# Patient Record
Sex: Male | Born: 1945 | Race: White | Hispanic: No | Marital: Married
Health system: Midwestern US, Academic
[De-identification: ages and names within clinical notes are randomized; demographics above are authoritative.]

---

## 2016-12-12 ENCOUNTER — Encounter: Admit: 2016-12-12 | Discharge: 2016-12-15 | Payer: MEDICARE

## 2016-12-12 DIAGNOSIS — M6289 Other specified disorders of muscle: Principal | ICD-10-CM

## 2016-12-15 DIAGNOSIS — N5231 Erectile dysfunction following radical prostatectomy: ICD-10-CM

## 2016-12-15 DIAGNOSIS — N393 Stress incontinence (female) (male): ICD-10-CM

## 2017-01-08 ENCOUNTER — Encounter: Admit: 2017-01-08 | Discharge: 2017-01-15 | Payer: MEDICARE

## 2017-01-15 DIAGNOSIS — M6289 Other specified disorders of muscle: Principal | ICD-10-CM

## 2017-01-15 DIAGNOSIS — N5231 Erectile dysfunction following radical prostatectomy: ICD-10-CM

## 2017-02-19 ENCOUNTER — Encounter: Admit: 2017-02-19 | Discharge: 2017-02-19 | Payer: MEDICARE

## 2017-02-19 DIAGNOSIS — C61 Malignant neoplasm of prostate: Principal | ICD-10-CM

## 2017-02-19 LAB — PROSTATIC SPECIFIC ANTIGEN-PSA: Lab: 0 ng/mL — ABNORMAL HIGH (ref <6.01)

## 2017-02-20 ENCOUNTER — Encounter: Admit: 2017-02-20 | Discharge: 2017-02-20 | Payer: MEDICARE

## 2017-02-20 DIAGNOSIS — N529 Male erectile dysfunction, unspecified: ICD-10-CM

## 2017-02-20 DIAGNOSIS — C61 Malignant neoplasm of prostate: Secondary | ICD-10-CM

## 2017-02-20 DIAGNOSIS — K219 Gastro-esophageal reflux disease without esophagitis: ICD-10-CM

## 2017-02-20 DIAGNOSIS — E785 Hyperlipidemia, unspecified: ICD-10-CM

## 2017-02-20 DIAGNOSIS — G4733 Obstructive sleep apnea (adult) (pediatric): ICD-10-CM

## 2017-02-20 DIAGNOSIS — I10 Essential (primary) hypertension: ICD-10-CM

## 2017-02-20 DIAGNOSIS — R972 Elevated prostate specific antigen [PSA]: ICD-10-CM

## 2017-02-20 DIAGNOSIS — C4491 Basal cell carcinoma of skin, unspecified: ICD-10-CM

## 2017-02-20 DIAGNOSIS — I719 Aortic aneurysm of unspecified site, without rupture: ICD-10-CM

## 2017-02-20 DIAGNOSIS — N400 Enlarged prostate without lower urinary tract symptoms: Principal | ICD-10-CM

## 2017-02-20 NOTE — Progress Notes
Name: Kenneth Macdonald          MRN: [id number]      DOB: [birth year]      AGE: 71 y.o.   DATE OF SERVICE: 02/20/2017    Subjective:             Reason for Visit:  Prostate Cancer      Kenneth Macdonald is a 71 y.o. male.     Cancer Staging  Prostate cancer [facility])  Staging form: Prostate, AJCC 8th Edition  - Pathologic stage from 07/31/2016: Stage IIIB (pT3b, pN0, cM0, PSA: 4.5, Grade Group: 3) - Signed by Maryagnes Amos, PA-C on 02/19/2017      History of Present Illness  Kenneth Macdonald is a very pleasant 71 year old man who is 6 months from his robotic prostatectomy complicated by an incarcerated umbilical hernia.  He has been doing well since his last visit with 1-3 pad per day incontinence.  He is awakening at night multiple times; but he notes that he drinks a lot in the evening as well.  Otherwise no complaints.         Review of Systems   Constitutional: Positive for activity change, appetite change and diaphoresis. Negative for chills, fatigue, fever and unexpected weight change.   HENT: Positive for hearing loss and tinnitus. Negative for congestion, dental problem, drooling, ear discharge, ear pain, facial swelling, mouth sores, nosebleeds, postnasal drip, rhinorrhea, sinus pressure, sneezing, sore throat, trouble swallowing and voice change.    Eyes: Negative for photophobia, pain, discharge, redness, itching and visual disturbance.   Respiratory: Positive for apnea. Negative for cough, choking, chest tightness, shortness of breath, wheezing and stridor.    Cardiovascular: Negative for chest pain, palpitations and leg swelling.   Gastrointestinal: Negative for abdominal distention, abdominal pain, anal bleeding, blood in stool, constipation, diarrhea, nausea, rectal pain and vomiting.   Endocrine: Negative for cold intolerance, heat intolerance, polydipsia, polyphagia and polyuria.   Genitourinary: Positive for enuresis. Negative for decreased urine volume, difficulty urinating, discharge, dysuria, flank pain, frequency, genital sores, hematuria, penile pain, penile swelling, scrotal swelling, testicular pain and urgency.   Musculoskeletal: Negative for arthralgias, back pain, gait problem, joint swelling, myalgias, neck pain and neck stiffness.   Skin: Negative for color change, pallor, rash and wound.   Neurological: Negative for dizziness, tremors, seizures, syncope, facial asymmetry, speech difficulty, weakness, light-headedness, numbness and headaches.   Hematological: Negative for adenopathy. Does not bruise/bleed easily.   Psychiatric/Behavioral: Negative for agitation, behavioral problems, confusion, decreased concentration, dysphoric mood, hallucinations, self-injury, sleep disturbance and suicidal ideas. The patient is not nervous/anxious and is not hyperactive.          Objective:         ??? ACETAMINOPHEN (TYLENOL PO) Take 1,000 mg by mouth daily as needed.   ??? aspirin EC 81 mg tablet Take 81 mg by mouth daily. Take with food.   ??? cholecalciferol (VITAMIN D-3) 1,000 units tablet Take 5,000 Units by mouth daily.   ??? FOLIC ACID/MULTIVIT-MIN/LUTEIN (CENTRUM SILVER PO) Take 1 tablet by mouth daily.   ??? hyoscyamine sulfate (LEVSIN/SL) 0.125 mg sublingual tablet Place 1 tablet under tongue every 4 hours as needed for Cramps. Bladder spasms   ??? lisinopril-hydrochlorothiazide (PRINZIDE, ZESTORETIC) 20-12.5 mg tablet Take 1 tablet by mouth every morning.   ??? loratadine (CLARITIN) 10 mg tablet Take 10 mg by mouth every morning.   ??? naproxen (NAPROSYN) 500 mg tablet Take 500 mg by mouth daily. Take with food.   ???  nitrofurantoin monohyd/m-cryst (MACROBID) 100 mg capsule Take 1 capsule by mouth every 12 hours. Take with food. Begin taking the day prior to catheter removal.   ??? omega 3-dha-epa-fish oil (FISH OIL) 100-160-1,000 mg cap Take 2 capsules by mouth daily.   ??? omeprazole DR(+) (PRILOSEC) 20 mg capsule Take 20 mg by mouth daily before breakfast. ??? oxybutynin chloride (DITROPAN) 5 mg tablet Take 1 tablet by mouth three times daily as needed. Bladder spasms   ??? oxyCODONE/acetaminophen (PERCOCET; ENDOCET; ROXICET) 5/325 mg tablet Take 1 tablet by mouth every 4 hours as needed   ??? polyethylene glycol 3350 (GLYCOLAX; MIRALAX) 17 gram/dose powder Take 17 g by mouth daily.   ??? polymyxin/bacitracin/neo/HC (CORTISPORIN) 1 % topical ointment Small amount to head of penis/catheter 3 times daily to reduce irritation.   ??? senna/docusate (SENOKOT-S) 8.6/50 mg tablet Take 1 tablet by mouth twice daily.   ??? simvastatin (ZOCOR) 40 mg tablet Take 20 mg by mouth at bedtime daily.   ??? vitamin E 400 unit capsule Take 400 Units by mouth daily.     Vitals:    02/20/17 0757   BP: 144/78   Pulse: 78   Resp: 18   Temp: 36.8 ???C (98.2 ???F)   TempSrc: Oral   SpO2: 97%   Weight: 134.2 kg (295 lb 13.7 oz)   Height: 185.4 cm (72.99)     Body mass index is 39.04 kg/m???.     Pain Score: Zero         Pain Addressed:  N/A    Patient Evaluated for a Clinical Trial: Patient not eligible for a treatment trial (including not needing treatment, needs palliative care, in remission). and No treatment clinical trial available for this patient.     [place] Cooperative Oncology Group performance status is 0, Fully active, able to carry on all pre-disease performance without restriction.Marland Kitchen     Physical Exam   Constitutional: He is oriented to person, place, and time. He appears well-developed and well-nourished. No distress.   HENT:   Head: Normocephalic and atraumatic.   Eyes: EOM are normal. No scleral icterus.   Pulmonary/Chest: Effort normal. No respiratory distress.   Abdominal: Soft. He exhibits no distension.   Well healed incisions.   Musculoskeletal: Normal range of motion. He exhibits no edema.   Neurological: He is alert and oriented to person, place, and time.   Skin: Skin is warm and dry. No rash noted.   Psychiatric: He has a normal mood and affect. His behavior is normal. Judgment and thought content normal.   Vitals reviewed.       Lab Results   Component Value Date    PSA 0.08 02/19/2017    PSA 0.06 11/13/2016          Assessment and Plan:    Problem   Prostate Cancer (Hcc)    Fusion biopsy 06/10/16  Gleason 3+3=6 right prostate 1/6  Gleason 3+4=7 left prostate involving  4/6 cores    07/31/2016: RALP, BPLND, BWD Gleason 4+3=7 with tertiary 5, pT3aN0 (0/29)MxR1 (2mm pattern 4 and 5 at margin).  Lab Results   Component Value Date    PSA 0.08 02/19/2017    PSA 0.06 11/13/2016              Prostate cancer (HCC)  I had the pleasure of visiting with Kenneth Macdonald in clinic today.  He is recovering well with 1-3 pad per day incontinence.  I reassured him this should continue to improve.  Unfortunately his  PSA returned at 0.08.  At this time, based on his pathology, I recommended returning in 3 months with a PSA and radiation oncology consultation.    Plan:  1.  RTC in 3 months with a PSA  2.  Radiation oncology consult    Myna Hidalgo, MD  Urologic Oncology  Department of Urology

## 2017-02-20 NOTE — Assessment & Plan Note
I had the pleasure of visiting with Mr. Kenneth Macdonald in clinic today.  He is recovering well with 1-3 pad per day incontinence.  I reassured him this should continue to improve.  Unfortunately his PSA returned at 0.08.  At this time, based on his pathology, I recommended returning in 3 months with a PSA and radiation oncology consultation.    Plan:  1.  RTC in 3 months with a PSA  2.  Radiation oncology consult

## 2017-03-21 ENCOUNTER — Encounter: Admit: 2017-03-21 | Discharge: 2017-03-21 | Payer: MEDICARE

## 2017-03-21 DIAGNOSIS — C61 Malignant neoplasm of prostate: Principal | ICD-10-CM

## 2017-03-21 NOTE — Telephone Encounter
Patients daughter called in wanting a referral placed to have her dad do genetic testing. States that during the last appt Dr Jerline Pain thought that it would be a good idea that the children be tested. She states that she has developed a lump on her breast and was told by her Dr that her dad needs to have genetic testing done also.  Can we place a order that have this done.  Please call patients daughter for further questions.

## 2017-03-21 NOTE — Progress Notes
Received message from patient's daughter.  I called patient and explained that his daughter reached out to [place] requesting that he undergo genetic testing.  He was unsure if he wanted to have genetic testing; explained role of genetic counselor.  Patient agreeable to see genetic counselor.    Then returned call to daughter, Earnest Bailey.  She is wanting father to have genetic testing within the next week as she just found new breast mass.  Ordered entered for Dietitian.  Will reach out to staff to coordinate this as soon as possible.

## 2017-03-25 ENCOUNTER — Encounter: Admit: 2017-03-25 | Discharge: 2017-03-25 | Payer: MEDICARE

## 2017-03-25 DIAGNOSIS — C61 Malignant neoplasm of prostate: Principal | ICD-10-CM

## 2017-03-26 LAB — MISC REFERENCE TEST

## 2017-03-26 NOTE — Progress Notes
Hereditary Cancer Clinic   Genetic Counseling  Date: 03/26/2017    Kenneth Macdonald  [place]# [id number]  DOB July 24, 1945    Referral / Diagnosis: Prostate Cancer  Family history ovarian cancer, breast cancer, prostate cancer, colon cancer, uterus cancer    Ref: Self / [place], MS, [facility] (seeing his daughter in clinic)  [place] Provider(s):  Ross Marcus, MD    Kenneth Macdonald, 71 y.o., was seen in clinic today with his wife and daughter because of his the diagnosis of prostate cancer (dx 52), and extensive family history of cancer including two sisters and his mother with ovarian cancer, two sisters and a niece with breast cancer, and a sister with colon cancer. He also has a niece with uterine cancer.  Clinical hx provided by patient/family: Prostate cancer dx 18 (Gleason 7)    Family History provided (see 3-generation pedigree):  - Maternal:  Mother d. 31 ovarian cancer dx 68.  Aunts / Uncles / Cousins: Aunt d. 34s?, with son d. 67s car accident, and a daughter now in her 4s.  Grandparents: Both grandparents died in adulthood, no ca  [place]: [place] / no known Ashkenazi Jewish ancestry  - Paternal:  Father d. 25 lung ca dx 95  Aunts / Uncles / Cousins: Uncle d. 44 no ca (he had son d. 34s ?prostate ca ?lung ca, and another son d. Lung ca)  Grandparents: Grandparents both died in their 71s, no known ca  Ancestry: [place] / no known Ashkenazi Jewish ancestry  - Siblings (11):   Sister Kenneth Macdonald d. 17 ovarian ca dx 63s (TAH) - she had a daughter now age 53 with breast cancer dx before age 25   Sister Kenneth Macdonald d. 78 colon cancer dx 77   Sister Kenneth Macdonald d. 73 ovarian cancer dx 74s   Sister Kenneth Macdonald 45, breast cancer dx 65   Brother Kenneth Macdonald d. 81 ?prostate cancer dx 90   Brother Kenneth Macdonald d. 66 lung ca dx 82s   Sister Kenneth Macdonald now age 34, breast cancer dx 67 (ER+) - she has a daughter age 2 with uterine cancer dx 37   Sister Kenneth Macdonald deceased, BSO 12s   Sister Kenneth Macdonald d. 30s brain hemorrhage Sister Kenneth Macdonald deceased, TAH/BSO 63s   Brother Kenneth Macdonald d. 33  - Children (2): Daughter 49 with TAH (bleeding), and 67 (here today)    Of note: his wife's mother had peritoneal cancer, she plans to return with him to clinic for his follow-up and have genetic testing at that time.    Discussion:    - We discussed hereditary cancer syndromes involving prostate, ovarian cancer, breast cancer, prostate cancer, colon cancer, uterus cancer and other cancers.  Red flags for hereditary syndromes include bilateral / multifocal disease, young age of onset (often <50 yrs), and 2-3 relatives with the same, or related conditions.  - Testing for multiple genes for cancer susceptibility was recommended (a genetic panel).  IF a gene from the panel is found, this would affect medical management including surveillance for early detection and treatment decisions.  - We reviewed the possible outcomes of testing (e.g., positive, negative or variant of uncertain/unknown significance)  - We discussed the various syndromes and diseases associated with the genes on the panel.  - NCCN guidelines discussed for Hereditary Breast / Ovarian Cancer (HBOC), Lynch Syndrome and other conditions associated with genes on the panel.     Our discussion also included the patient's increased risk status, how genes affect cancer susceptibility, potential benefits,  risk, and limitations of testing, as well as methods for early detection and prevention.     Plan:??? Blood was drawn today for the [facility] CustomNext Cancer Panel (38 genes) including:  - CancerNext Panel 34 genes: APC, ATM, BARD1, BMPR1A, BRCA1, BRCA2, BRIP1, CDH1, CDK4, CDKN2A, CHEK2, DICER1, HOXB13, MLH1, MRE11A, MSH2 (includes  MSH2 inversion analysis), MSH6, MUTYH, NBN, NF1, PALB2, PMS2, POLD1, POLE, PTEN, RAD50, RAD51C, RAD51D, SMAD4, SMARCA4, STK11 and TP53 (sequencing and deletion/duplication); EPCAM and GREM1 (deletion/duplication only)??? including genes for susceptibility to breast cancer, ovarian cancer, colon cancer, pancreas cancer and other genes associated with colon polyps and other hereditary cancer.??? Sequencing / deletion / duplication unless otherwise noted.???  - plus AXIN2, MSH3, NTHL1, FH    Information regarding insurance was discussed: The laboratory will preauthorize with insurance and provide notification if the cost is not covered, or if there will be any out-of-pocket responsibilities.    Time:  60 minutes.  Obtained and reviewed family history.  Discussed hereditary cancer syndromes, autosomal dominant inheritance, presymptomatic surveillance, management and treatment.  Testing outcomes, including possibility of uncertain variants, strategies for testing other family members if a gene alteration is found.    Completed paperwork including consent for genetic testing.    Kenneth Mainland, MS, [facility]  Certified Genetic Counselor  [facility]  [address] [place], [place]  [address]    Email: [email or web address]  Telephone [phone]  Fax:  [phone]  Appointments [phone]    Pending:  Follow-up in Clinic for DNA test results / interpretation.    Appointment in 2-4 weeks  to discuss genetic test results and additional testing on family members as appropriate.    Note: His daughter would benefit from this information, and results were requested ASAP    Note: IF the genetic testing results are positive, genetic testing for relatives will be at no charge within 90 days of the test report. This information was provided

## 2017-04-12 ENCOUNTER — Encounter: Admit: 2017-04-12 | Discharge: 2017-04-12 | Payer: MEDICARE

## 2017-04-12 NOTE — Telephone Encounter
Message received from patients wife stating "His results for genetic testing were supposed to be posted before yesterday and they and the weren't.  I wondered if anybody could help [place] with that?"    Routing to nurse Cloyde Reams J. for follow up.

## 2017-05-14 ENCOUNTER — Encounter: Admit: 2017-05-14 | Discharge: 2017-05-14 | Payer: MEDICARE

## 2017-05-14 DIAGNOSIS — Z803 Family history of malignant neoplasm of breast: ICD-10-CM

## 2017-05-14 DIAGNOSIS — C61 Malignant neoplasm of prostate: Principal | ICD-10-CM

## 2017-05-14 DIAGNOSIS — Z8042 Family history of malignant neoplasm of prostate: ICD-10-CM

## 2017-05-14 DIAGNOSIS — Z8041 Family history of malignant neoplasm of ovary: ICD-10-CM

## 2017-05-15 NOTE — Progress Notes
Hereditary Cancer Clinic   Genetic Counseling  Follow-up for Test Results  Date: 05/14/2017    Kenneth Macdonald  [place]# [id number]  DOB [birth year]    Referral / Diagnosis: Prostate Cancer  Family history ovarian cancer, breast cancer, prostate cancer, colon cancer, uterus cancer  ???  Ref: Self / French Ana, MS, [facility] (seeing his daughter in clinic)  [place] Provider(s):  Ross Marcus, MD    Kenneth Macdonald, 71 y.o., was seen today (with his wife) to review his genetic test results for the following   Genetic Panel (see [facility] lab report):    [facility] CustomNext Cancer Panel (38 genes) including:  - CancerNext Panel 34 genes: APC, ATM, BARD1, BMPR1A, BRCA1, BRCA2, BRIP1, CDH1, CDK4, CDKN2A, CHEK2, DICER1, HOXB13, MLH1, MRE11A, MSH2 (includes??? MSH2 inversion analysis), MSH6, MUTYH, NBN, NF1, PALB2, PMS2, POLD1, POLE, PTEN, RAD50, RAD51C, RAD51D, SMAD4, SMARCA4, STK11 and TP53 (sequencing and deletion/duplication); EPCAM and GREM1 (deletion/duplication only)??? including genes for susceptibility to breast cancer, ovarian cancer, colon cancer, pancreas cancer and other genes associated with colon polyps and other hereditary cancer.??? Sequencing / deletion / duplication unless otherwise noted.???  - plus AXIN2, MSH3, NTHL1, FH    RESULTS: (see Genetics Lab report for more details)     1) NEGATIVE for most of the genes on the nextgeneration cancer panel:    - no deleterious genetic mutations were found in the BRCA1 gene or the BRCA2 gene  (genes associated with Hereditary Breast Ovarian Cancer - HBOC)  - no deleterious genetic mutations were found in genes associated with Lynch syndrome (MLH1, MSH2, MSH6, PMS2, EPCAM)  - no deleterious mutations found in genes associate with polyposis (APC, MUTYH, GREM1, POLD1, POLE)  - no deleterious genetic mutation were found in other genes on the panel associated with prostate, breast, ovary, uterus, colon, pancreas or other cancers Results interpretation: NEGATIVE for genes requiring clinical action    2) 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   A VUS is a change, or variant, in a gene that has never been seen before or because of conflicting or incomplete information in the medical literature, its association with cancer risk is unknown. In other words, it cannot be determined yet whether this genetic variant is associated with an increased risk of cancer or is a harmless, normal genetic variant.    Discussion:   This essentially negative result does not exclude a genetic basis for the reported personal and/or family history of cancer. It is possible that there is a pathogenic mutation that is not detectable by this analysis or in a gene that is not included on the panel.     Recommendations: No clinical action is indicated at this time, based on this genetic variant. In the absence of a definitive mutation, this the risk for future cancers and medical management recommendations should be based on personal and family history of cancer.    For individuals  diagnosed with cancer  - Continue medical care as recommended. It is possible that this genetic variation is the reason for developed cancer; however, it is also possible that this variant is a normal genetic variant and is not the cause for cancer.   - A VUS may be reclassified. If this is the  case, a new report will be re-issued, which could provide valuable information to doctors and extended family members who may wish to pursue predictive genetic testing.  - Risk for future cancers and medical management recommendations should be based on personal and family history of cancer - following appropriate guideline for screening including mammograms, colonoscopies, and other evaluations.    - This negative result does not exclude a genetic basis for the reported personal and/or family history of cancer in this patient. It is possible that this patient has a pathogenic mutation that is not detectable by this analysis or in a gene that is not included on the panel.     Family Members Without Cancer  - It is not recommended that family members without associated cancers undergo predictive testing for variants of unknown significance since this would not provide additional information about whether family members are at increased risk to develop cancer.  - Individuals cannot pass genes (mutated genes) to their children which they do not have.  Children therefore do not need to be tested, based on these test results (unless there are other risk factors from the other side of the family)    -  VUS testing is not offered for offspring because of the lack of clinical guidelines for any test results.    Consider checking back periodically to see if newer tests are available in the future with more genes which may be tested.    Regarding the VUS (Variant of Uncertain / Unknown Significance) suggestions:  - consider entering data into the PROMPT registry - an online registry for patients who have had genetic panel testing and are found to have mutations or variants of uncertain significance (VUS). This registry is a joint effort between laboratories and academic medical centers to learn more about the genes studied on these panels and hopefully, over time, develop a better understanding of the cancer risks associated with these genes and the best way to take care of patients with these mutations. While all of the genes on these panels have been tied to an increased risk of cancer, the understanding of the risks associated with some of the genes are understood more than others. Participation involves completing online surveys and patients can enroll directly through the provided website if interested www.promptstudy.org.   - consider periodic review of ClinVar or other variant classification database (see instructions provided)  - consider participating in online research, GenomeConnect (genomeconnect.org) to advance knowledge of genetic disorders by sharing de-identified genetic and health information    Truett Mainland, MS, [facility]  Certified Genetic Counselor  [place] [facility]  [address] [place], [place]  [address]    Email: [email or web address]  Telephone [phone]  Fax:  [phone]  Appointments [phone]    Copy of laboratory test results, clinical notes, pedigree, lab test information provided in clinic.   Copy of test results and pedigree to electronic medical record.   He has requested that his records be sent to French Ana, MS, [facility] genetic counselor for his daughter      See previous clinic notes:  --------------------------------------------------------------------------------------------------------------------------------------------------------------------------------------------------------------------------------------------------------  Hereditary Cancer Clinic   Genetic Counseling  Date: 03/26/2017  ???  Bianca Vester  [place]# [id number]  DOB 05-05-1946  ???  Referral / Diagnosis: Prostate Cancer  Family history ovarian cancer, breast cancer, prostate cancer, colon cancer, uterus cancer  ???  Ref: Self / [place], MS, [facility] (seeing his daughter in clinic)  [place] Provider(s):  Ross Marcus, MD  ???  Kenneth Macdonald, 71 y.o., was seen in clinic today with his wife and daughter because of his the diagnosis of prostate cancer (dx 58), and extensive family history of cancer including two sisters and his mother with ovarian cancer, two sisters and a niece with breast cancer, and a sister with colon cancer. He also has a niece with uterine cancer.  Clinical hx provided by patient/family: Prostate cancer dx 71 (Gleason 7)  ??? Family History provided (see 3-generation pedigree):  - Maternal:  Mother d. 100 ovarian cancer dx 69.  Aunts / Uncles / Cousins: Aunt d. 76s?, with son d. 4s car accident, and a daughter now in her 75s.  Grandparents: Both grandparents died in adulthood, no ca  [place]: [place] / no known Ashkenazi Jewish ancestry  - Paternal:  Father d. 92 lung ca dx 46  Aunts / Uncles / Cousins: Uncle d. 30 no ca (he had son d. 72s ?prostate ca ?lung ca, and another son d. Lung ca)  Grandparents: Grandparents both died in their 77s, no known ca  Ancestry: [place] / no known Ashkenazi Jewish ancestry  - Siblings (11):               Sister Genevieve d. 58 ovarian ca dx 5s (TAH) - she had a daughter now age 99 with breast cancer dx before age 54               Sister Mary d. 33 colon cancer dx 83               Sister Windell Moulding d. 38 ovarian cancer dx 71s               Sister Okey Regal 38, breast cancer dx 30               Brother Delbert d. 25 ?prostate cancer dx 22               Brother Phil d. 30 lung ca dx 75s               Sister Britta Mccreedy now age 65, breast cancer dx 66 (ER+) - she has a daughter age 16 with uterine cancer dx 44               Sister [place] deceased, BSO 27s               Sister Environmental health practitioner d. 30s brain hemorrhage               Sister Nelda deceased, TAH/BSO 37s               Brother Charles d. 42  - Children (2): Daughter 90 with TAH (bleeding), and 61 (here today)    Of note: his wife's mother had peritoneal cancer, she plans to return with him to clinic for his follow-up and have genetic testing at that time.  ???  Discussion:    - We discussed hereditary cancer syndromes involving prostate, ovarian cancer, breast cancer, prostate cancer, colon cancer, uterus cancer and other cancers.  Red flags for hereditary syndromes include bilateral / multifocal disease, young age of onset (often <50 yrs), and 2-3 relatives with the same, or related conditions. - Testing for multiple genes for cancer susceptibility was recommended (a genetic panel).  IF a gene from the panel is found, this would affect medical management including surveillance for early detection and treatment decisions.  - We reviewed the possible outcomes of  testing (e.g., positive, negative or variant of uncertain/unknown significance)  - We discussed the various syndromes and diseases associated with the genes on the panel.  - NCCN guidelines discussed for Hereditary Breast / Ovarian Cancer (HBOC), Lynch Syndrome and other conditions associated with genes on the panel.   ???  Our discussion also included the patient's increased risk status, how genes affect cancer susceptibility, potential benefits, risk, and limitations of testing, as well as methods for early detection and prevention.   ???  Plan:??? Blood was drawn today for the [facility] CustomNext Cancer Panel (38 genes) including:  - CancerNext Panel 34 genes: APC, ATM, BARD1, BMPR1A, BRCA1, BRCA2, BRIP1, CDH1, CDK4, CDKN2A, CHEK2, DICER1, HOXB13, MLH1, MRE11A, MSH2 (includes??? MSH2 inversion analysis), MSH6, MUTYH, NBN, NF1, PALB2, PMS2, POLD1, POLE, PTEN, RAD50, RAD51C, RAD51D, SMAD4, SMARCA4, STK11 and TP53 (sequencing and deletion/duplication); EPCAM and GREM1 (deletion/duplication only)??? including genes for susceptibility to breast cancer, ovarian cancer, colon cancer, pancreas cancer and other genes associated with colon polyps and other hereditary cancer.??? Sequencing / deletion / duplication unless otherwise noted.???  - plus AXIN2, MSH3, NTHL1, FH  ???  Information regarding insurance was discussed: The laboratory will preauthorize with insurance and provide notification if the cost is not covered, or if there will be any out-of-pocket responsibilities.  ???  Time:  60 minutes.  Obtained and reviewed family history.  Discussed hereditary cancer syndromes, autosomal dominant inheritance, presymptomatic surveillance, management and treatment.  Testing outcomes, including possibility of uncertain variants, strategies for testing other family members if a gene alteration is found.    Completed paperwork including consent for genetic testing.  ???  Truett Mainland, MS, [facility]  Certified Genetic Counselor  [place] [facility]  [address] [place], [place]  [address]  ???  Email: [email or web address]  Telephone [phone]  Fax:  [phone]  Appointments [phone]  ???  Pending:  Follow-up in Clinic for DNA test results / interpretation.    Appointment in 2-4 weeks  to discuss genetic test results and additional testing on family members as appropriate.  ???  Note: His daughter would benefit from this information, and results were requested ASAP  ???  Note: IF the genetic testing results are positive, genetic testing for relatives will be at no charge within 90 days of the test report. This information was provided

## 2017-06-02 ENCOUNTER — Ambulatory Visit: Admit: 2017-06-02 | Discharge: 2017-06-17 | Payer: MEDICARE

## 2017-06-04 ENCOUNTER — Encounter: Admit: 2017-06-04 | Discharge: 2017-06-04 | Payer: MEDICARE

## 2017-06-04 DIAGNOSIS — C61 Malignant neoplasm of prostate: Principal | ICD-10-CM

## 2017-06-04 LAB — PROSTATIC SPECIFIC ANTIGEN-PSA: Lab: 0 ng/mL (ref <6.01)

## 2017-06-04 NOTE — Progress Notes
Treatment Summary for Prostate cancer [facility])     San Jetty, APRN-NP  06/04/2017  7:48 PM      Cancer Treatment Summary  Provided by San Jetty, APRN-NP on 06/04/2017       General Information   Patient Name Kenneth Macdonald   Patient ID [id number]   Phone [phone] (home)    Date of birth [birth year]   Age 71 y.o.   Support Contact Extended Emergency Contact Information  Primary Emergency Contact: Galan,Delores   [place]  Home Phone: [phone]  Relation: Spouse         Care Team   Patient Care Team:  Early, Otis Dials, MD as PCP - General (Orthopedic Surgery)      Cancer Diagnosis Information   Symptoms/Signs Elevated PSA   Diagnosis Prostate cancer [facility])   Diagnosis Date 07/11/2015 (biopsy)   Staging Information Cancer Staging    Prostate cancer [facility])  Staging form: Prostate, AJCC 8th Edition    - Pathologic stage from 07/31/2016: Stage IIIB (pT3b, pN0, cM0, PSA: 4.5, Grade Group: 3) - Signed by Maryagnes Amos, PA-C on 02/19/2017       Tumor & Prognostic Markers PSA 4.5  No results found for: YQ657, CA2729, HER2NEU   Genomic Testing NA   Surgical Procedure: Location/Findings Past Surgical History:   Procedure Laterality Date   ??? KNEE ARTHROSCOPY Right ~2010   ??? SKIN CANCER EXCISION  ~1990    BCC Lip        Tumor Type/Histology/Grade Gleason 4+3=7         Background Information   Family History/predisposing conditions Family History   Problem Relation Age of Onset   ??? Cancer Mother    ??? Cancer Father    ??? Cancer Sister    ??? Diabetes Sister    ??? Cancer Brother    ??? Cancer-Prostate Brother    ??? Diabetes Brother         Warden/ranger NA   Social History Social History     Tobacco Use   ??? Smoking status: Former Smoker     Packs/day: 1.00     Years: 20.00     Pack years: 20.00     Types: Cigarettes     Last attempt to quit: 07/19/2001     Years since quitting: 15.8   ??? Smokeless tobacco: Never Used   Substance Use Topics   ??? Alcohol use: No   ??? Drug use: No                       [No treatment plan] Pre-Treatment Post-Treatment   Height  Ht Readings from Last 1 Encounters:   02/20/17 185.4 cm (72.99)        Weight  Wt Readings from Last 1 Encounters:   02/20/17 134.2 kg (295 lb 13.7 oz)        BSA  Estimated body surface area is 2.63 meters squared as calculated from the following:    Height as of 02/20/17: 185.4 cm (72.99).    Weight as of 02/20/17: 134.2 kg (295 lb 13.7 oz).      Lifetime Dosage   Lifetime Dose Tracking:   No doses have been documented on this patient for the following tracked chemicals: mitomycin, epirubicin, doxorubicin, idarubicin, bleomycin, daunorubicin, mitoxantrone, vincristine, doxorubicin HCl pegylated liposomal, daunorubicin citrate liposomal              Follow-Up & Survivorship Care   Future Appointments   Date Time  Provider Department Center   06/05/2017 10:00 AM Ross Marcus, MD [facility] [place] Ex[facility]   06/05/2017 11:00 AM Harriette Bouillon, MD [facility]     Cancer surveillance:     1. Treatment team may include:  a. Urologic oncologist  b. Radiation oncologist  c. Medical oncologist  2. Follow-up interval:  Your treatment team will continue to monitor you in case the prostate cancer returns.  Follow-up testing will include period PSA measurements and evaluations for side effects from treatment:  a. Year 1: Every 3 months  b. Year 2: Every 6 months  c. Year 3-5: Yearly  d. After year 5: PSA should be checked every year with any physcian  3. Labs:  You will have a PSA lab check with your follow-up visits.  a. PSA should be low after treatment.  After surgery, unless your physician develops a different plan of care, a PSA of <0.2 is considered acceptable.  Anything over 0.2 is concerning for recurrence and should be evaluated by your surgeon.     Side effect surveillance:     Common side effects:  some long-term side effects may happen after surgery.  The two most common issues are erectile dysfunction and incontinence. - Patients who have had their prostate removed may experience incontinence (leakage).  Please make sure you continue doing the Kegel (pelvic floor) exercises.  The incontinence should improve over time but if this persists or is bothersome, please discuss with your care team as there are other options available for treatment of urinary incontinence after surgery.  - Erectile dysfunction.  Many factors can affect your erectile function after surgery and it make take several years for this to improve.  There are various options available for the treatment of erectile dysfunction, including oral, intraurethral and injection therapies; a vacuum device; and a penile prosthesis.  Please let us know if you experience worsening erections and wish to try medication or other interventions.     Hormonal Therapy     Hormonal therapy is commonly used for patients with prostate cancer.  Common forms of hormonal therapy are daily pills (such as biclutamide) or injections (such as Lupron or Degarelix).  These treatments are designed to eliminate the male hormones (androgens) from the body.  Not having androgens in the body starves the prostate cancer of its most important growth factor and helps to slow recurrence.     Frequent side-effects of hormonal therapy:  1. Fatigue  2. Decreased concentration or mood changes  3. Loss of libido (sex drive)  4. Slower metabolism  5. Loss of muscle mass/weight gain  6. Bone loss  7. Muscle or joint aches     Additional health considerations during hormonal therapy  1. You should have regular follow-ups with your primary care physician so that they can follow your blood glucose levels as well as your cholesterol levels.  2. Bone health: you should be taking additional calcium and vitamin D.    a. For men age 47-70: 1000 mg calcium and 400 ??? 1000 IU of vitamin D  b. For men age over 15: 1200 mg calcium and 800 ??? 1000 IU of vitamin D c. If you are on long-term hormonal therapy, your doctor may order a DEXA (bone density) scan to see if you need additional medications to help strengthen the bones to help prevent fracture.  3. Diet/Exercise:  It is very important to eat healthy foods and stay active with aerobic exercise during hormonal therapy to prevent some  of the above side effects from hormonal therapy.          Health Maintenance:      For patients with prostate cancer, patients are actually more likely to die from heart disease or other causes than from prostate cancer.  It is very important to maintain good overall health.  This includes:     1. Regular check-up with your primary care physician   2. Smoking.  If you are a smoker, we strongly encourage you to quit smoking.  If you need help to stop smoking, please let us know.  3. Eat a heart healthy diet  a. Avoid excess alcohol or sugary beverages  b. Avoid processed foods   c. [facility] offers free nutritional counseling for patients with cancer.  If you would like to meet with a registered dietician to discuss your diet, please let us know so that we can coordinate this.  4. Stay fit with daily exercise  5. Cancer screening.   a. Screening colonoscopies  b. If you have a strong history of smoking, please ask about lung cancer screening with low-dose chest CT.  6. Kegel exercises   a. Daily Kegel exercises???especially crucial in patients after prostatectomy.  b. While you???re urinating, try to stop the flow of urine. Start and stop it as often as you can.   c. Contract as if you were stopping your urine stream, but do it when you???re not urinating  d. Tighten your rectum as if trying not to pass gas. Contract your anus, but don???t move your buttocks.   e. Do your Kegels as often as you can. The more you do them, the faster you???ll feel the results.   f. Pick an activity you do often as a reminder to do your Kegel exercises. For instance, do your Kegels every time you sit down. Frequently Asked Questions:     1. How will I know if the cancer has come back?     The PSA (prostate specific antigen) blood test is the most sensitive test for detecting prostate cancer recurrence.  The PSA will almost always rise first before cancer is detected in any other way (such as physical exam or scans).  A rising PSA does not always mean that the prostate cancer has recurred.  Please consult your physician if you have questions about your PSA level.     2. Will there be any scans?      Generally, scans are not necessary after a prostatectomy.        3. Do I have any restrictions on my activities?     Not at all!  Actually, patients are encouraged to be as active as they can.   Increased physical activity is associated with better overall feeling and health.     4. Will alternative medicines/herbal supplements help my cancer?     There is no proven evidence that any supplement or herbal medication prevents cancer recurrence or reduces side effects.  It should be noted that herbal preparations are not monitored for purity by the FDA.  It is also important to realize that supplements may interact with other medications and treatments so they should be used cautiously.     5. I have a family history of prostate cancer.  What does this mean for my children?     Most prostate cancers are not inherited.  However, if you have a strong family history of prostate cancer or other cancers, it is worthwhile to consider genetic  counseling as other relatives may be at higher risk than the general population for certain cancers.  There are some tests to look for inherited genetic syndromes that may increase the risk of cancer.  Additionally, you should encourage your male children to undergo PSA testing in their 47s with anything greater than 1.5 warranting an evaluation.  Please ask [place] for more information.     Urology Main Phone Number: [phone] I will give a copy to the patient at his appointment tomorrow.

## 2017-06-05 ENCOUNTER — Encounter: Admit: 2017-06-05 | Discharge: 2017-06-05 | Payer: MEDICARE

## 2017-06-05 ENCOUNTER — Ambulatory Visit: Admit: 2017-06-05 | Discharge: 2017-06-05 | Payer: MEDICARE

## 2017-06-05 DIAGNOSIS — E785 Hyperlipidemia, unspecified: ICD-10-CM

## 2017-06-05 DIAGNOSIS — G4733 Obstructive sleep apnea (adult) (pediatric): ICD-10-CM

## 2017-06-05 DIAGNOSIS — C61 Malignant neoplasm of prostate: Principal | ICD-10-CM

## 2017-06-05 DIAGNOSIS — R972 Elevated prostate specific antigen [PSA]: ICD-10-CM

## 2017-06-05 DIAGNOSIS — K219 Gastro-esophageal reflux disease without esophagitis: ICD-10-CM

## 2017-06-05 DIAGNOSIS — C4491 Basal cell carcinoma of skin, unspecified: ICD-10-CM

## 2017-06-05 DIAGNOSIS — N529 Male erectile dysfunction, unspecified: ICD-10-CM

## 2017-06-05 DIAGNOSIS — I719 Aortic aneurysm of unspecified site, without rupture: ICD-10-CM

## 2017-06-05 DIAGNOSIS — I10 Essential (primary) hypertension: ICD-10-CM

## 2017-06-05 DIAGNOSIS — N400 Enlarged prostate without lower urinary tract symptoms: Principal | ICD-10-CM

## 2017-06-05 NOTE — Progress Notes
Had been getting psa checked at [place]. Felt nodule in past. Had biopsies in the past that were negative.  Lives in [place]  Had biopsy done at [facility] after MRI showed lesion.    Changes pad 6 times a day. Occasionally has leakage without any stress.  No longer improving.     Otherwise healthy.   Family history: Brother died from prostate cancer at 6. Dad had lung cancer. Mother had ovarian. 8/12 siblings had cancer. 2 sisters and a niece with breast cancer. 2 sisters with ovarian.    Had colonoscopy 3 months ago. 3 small polyps removed.     Has had genetic counseling. Brca1/2 negative. No other genetic mutatoins.

## 2017-06-05 NOTE — Progress Notes
Name: Kenneth Macdonald          MRN: [id number]      DOB: 03/19/1946      AGE: 71 y.o.   DATE OF SERVICE: 06/05/2017    Subjective:             Reason for Visit:  Heme/Onc Care      Kenneth Macdonald is a 71 y.o. male.     Cancer Staging  Prostate cancer [facility])  Staging form: Prostate, AJCC 8th Edition  - Pathologic stage from 07/31/2016: Stage IIIB (pT3b, pN0, cM0, PSA: 4.5, Grade Group: 3) - Signed by Maryagnes Amos, PA-C on 02/19/2017      History of Present Illness  Kenneth Macdonald is a very pleasant 71 year old man who has a history of prostate cancer treated with radical prostatectomy on 07/31/2016.  His pathology was notable for pT3bN0MxR1 4+3=7 with tertiary 5.  He has had persistent incontinence with 6-7 pad per day incontinence which he feels is related to his activity level.  He has not had any functional erections to date.         Review of Systems   Constitutional: Negative for activity change, appetite change, chills, diaphoresis, fatigue, fever and unexpected weight change.   HENT: Negative for congestion, dental problem, drooling, ear discharge, ear pain, facial swelling, hearing loss, mouth sores, nosebleeds, postnasal drip, rhinorrhea, sinus pressure, sneezing, sore throat, tinnitus, trouble swallowing and voice change.    Eyes: Negative for photophobia, pain, discharge, redness, itching and visual disturbance.   Respiratory: Negative for apnea, cough, choking, chest tightness, shortness of breath, wheezing and stridor.    Cardiovascular: Negative for chest pain, palpitations and leg swelling.   Gastrointestinal: Negative for abdominal distention, abdominal pain, anal bleeding, blood in stool, constipation, diarrhea, nausea, rectal pain and vomiting.   Endocrine: Negative for cold intolerance, heat intolerance, polydipsia, polyphagia and polyuria.   Genitourinary: Positive for enuresis. Negative for decreased urine volume, difficulty urinating, discharge, dysuria, flank pain, frequency, genital sores, hematuria, penile pain, penile swelling, scrotal swelling, testicular pain and urgency.   Musculoskeletal: Negative for arthralgias, back pain, gait problem, joint swelling, myalgias, neck pain and neck stiffness.   Skin: Negative for color change, pallor, rash and wound.   Neurological: Negative for dizziness, tremors, seizures, syncope, facial asymmetry, speech difficulty, weakness, light-headedness, numbness and headaches.   Hematological: Negative for adenopathy. Does not bruise/bleed easily.   Psychiatric/Behavioral: Negative for agitation, behavioral problems, confusion, decreased concentration, dysphoric mood, hallucinations, self-injury, sleep disturbance and suicidal ideas. The patient is not nervous/anxious and is not hyperactive.          Objective:         ??? ACETAMINOPHEN (TYLENOL PO) Take 1,000 mg by mouth daily as needed.   ??? aspirin EC 81 mg tablet Take 81 mg by mouth daily. Take with food.   ??? cholecalciferol (VITAMIN D-3) 1,000 units tablet Take 5,000 Units by mouth daily.   ??? FOLIC ACID/MULTIVIT-MIN/LUTEIN (CENTRUM SILVER PO) Take 1 tablet by mouth daily.   ??? hyoscyamine sulfate (LEVSIN/SL) 0.125 mg sublingual tablet Place 1 tablet under tongue every 4 hours as needed for Cramps. Bladder spasms   ??? lisinopril-hydrochlorothiazide (PRINZIDE, ZESTORETIC) 20-12.5 mg tablet Take 1 tablet by mouth every morning.   ??? loratadine (CLARITIN) 10 mg tablet Take 10 mg by mouth every morning.   ??? naproxen (NAPROSYN) 500 mg tablet Take 500 mg by mouth daily. Take with food.   ??? nitrofurantoin monohyd/m-cryst (MACROBID) 100 mg capsule Take  1 capsule by mouth every 12 hours. Take with food. Begin taking the day prior to catheter removal.   ??? omega 3-dha-epa-fish oil (FISH OIL) 100-160-1,000 mg cap Take 2 capsules by mouth daily.   ??? omeprazole DR(+) (PRILOSEC) 20 mg capsule Take 20 mg by mouth daily before breakfast.   ??? oxybutynin chloride (DITROPAN) 5 mg tablet Take 1 tablet by mouth three times daily as needed. Bladder spasms   ??? oxyCODONE/acetaminophen (PERCOCET; ENDOCET; ROXICET) 5/325 mg tablet Take 1 tablet by mouth every 4 hours as needed   ??? polyethylene glycol 3350 (GLYCOLAX; MIRALAX) 17 gram/dose powder Take 17 g by mouth daily.   ??? polymyxin/bacitracin/neo/HC (CORTISPORIN) 1 % topical ointment Small amount to head of penis/catheter 3 times daily to reduce irritation.   ??? senna/docusate (SENOKOT-S) 8.6/50 mg tablet Take 1 tablet by mouth twice daily.   ??? simvastatin (ZOCOR) 40 mg tablet Take 20 mg by mouth at bedtime daily.   ??? vitamin E 400 unit capsule Take 400 Units by mouth daily.     Vitals:    06/05/17 1014   BP: 142/78   Pulse: 84   Resp: 20   Temp: 36.7 ???C (98 ???F)   TempSrc: Oral   SpO2: 96%   Weight: (!) 137.8 kg (303 lb 12.8 oz)   Height: 185.4 cm (72.99)     Body mass index is 40.09 kg/m???.     Pain Score: Zero         Pain Addressed:  N/A    Patient Evaluated for a Clinical Trial: Patient not eligible for a treatment trial (including not needing treatment, needs palliative care, in remission). and No treatment clinical trial available for this patient.     [place] Cooperative Oncology Group performance status is 0, Fully active, able to carry on all pre-disease performance without restriction.Marland Kitchen     Physical Exam   Constitutional: He is oriented to person, place, and time. He appears well-developed and well-nourished. No distress.   HENT:   Head: Normocephalic and atraumatic.   Eyes: EOM are normal. No scleral icterus.   Neck: Normal range of motion.   Pulmonary/Chest: Effort normal. No respiratory distress.   Abdominal: Soft. Bowel sounds are normal. He exhibits no distension. There is no tenderness.   Well-healed incision sites.  No palpable hernia.   Musculoskeletal: Normal range of motion. He exhibits no edema.   Lymphadenopathy:     He has no cervical adenopathy.   Neurological: He is alert and oriented to person, place, and time.   Skin: Skin is warm and dry. Psychiatric: He has a normal mood and affect. His behavior is normal. Judgment and thought content normal.   Vitals reviewed.            Assessment and Plan:    Problem   Prostate Cancer (Hcc)    Fusion biopsy 06/10/16  Gleason 3+3=6 right prostate 1/6  Gleason 3+4=7 left prostate involving  4/6 cores    07/31/2016: RALP, BPLND, BWD Gleason 4+3=7 with tertiary 5, pT3aN0 (0/29)MxR1 (2mm pattern 4 and 5 at margin).  Lab Results   Component Value Date    PSA 0.09 06/04/2017    PSA 0.08 02/19/2017    PSA 0.06 11/13/2016              Prostate cancer [facility])  Mr. Skowron PSA is detectable but slowly rising.  He does have some incontinence, which is around 6-7 pads per day.  I discussed his need to increase his activity,  perform Kegel exercises, and lose weight as an initial treatment for his incontinence.  I encouraged him to visit with his PCP for his multiple other concerns.  Otherwise, I recommended continuing to monitor the PSA until the PSA reaches 0.2 as the definition of recurrence.      Plan:  1.  PSA in 3 months  2.  Kegel exercises and exercise / diet changes  3.  RTC in 3 months      Myna Hidalgo, MD  Urologic Oncology  Department of Urology

## 2017-06-05 NOTE — Assessment & Plan Note
Kenneth Macdonald PSA is detectable but slowly rising.  He does have some incontinence, which is around 6-7 pads per day.  I discussed his need to increase his activity, perform Kegel exercises, and lose weight as an initial treatment for his incontinence.  I encouraged him to visit with his PCP for his multiple other concerns.  Otherwise, I recommended continuing to monitor the PSA until the PSA reaches 0.2 as the definition of recurrence.      Plan:  1.  PSA in 3 months  2.  Kegel exercises and exercise / diet changes  3.  RTC in 3 months

## 2017-06-18 NOTE — Progress Notes
Radiation Oncology Consultation    Date:  06/18/2017    Kenneth Macdonald is a 72 y.o. male.     Requesting Provider: Ross Marcus, MD     The encounter diagnosis was Prostate cancer [facility]).  Staging: Cancer Staging  Prostate cancer [facility])  Staging form: Prostate, AJCC 8th Edition  - Pathologic stage from 07/31/2016: Stage IIIB (pT3b, pN0, cM0, PSA: 4.5, Grade Group: 3) - Signed by Maryagnes Amos, PA-C on 02/19/2017      History of Present Illness:   Kenneth Macdonald is a 72 y.o. male with prostate cancer who presents today for discussion of treatment options.  Patient has had a history of rising PSA from about 0.9 in 2009, up to 4.5 on 05/07/2016.  Of note, he had been on Proscar for some years for urinary symptoms.  He had had 2 prior negative biopsies.  On 06/10/2016, he underwent MRI which demonstrated a lesion in the left posterior lateral aspect of the gland.  He underwent fusion biopsy which demonstrated Gleason 3+4 cancer in 4 6 cores, and Gleason 3+3 = 6 in 1 of 6 cores.  The directed biopsy of the suspicious lesion was negative.  He elected to pursue radical prostatectomy, which was performed on 07/31/2016.  Pathology revealed a Gleason 4+3 cancer with tertiary 5 pattern.  Margin was positive at the left posterior lateral margin.  There was seminal vesicle involvement.  There was also extensive extracapsular extension on the left.  All nodes were negative.  He has somewhat difficult postoperative course, with an umbilical hernia and incarceration postoperatively which required emergent reduction and repair.  Postoperative PSA at 3 months was detectable at 0.06.  This has since increased to 0.09 on 06/04/2017.    He is now recovering well.  His urination recovery is stable.  He does use about 6 pads per day, but he is particularly conscientious about being dry.  He does have a family history of some cancers, and has had genetic evaluation.  He does not carry the BRCA gene mutation. He is referred to me for discussion of postoperative radiation.         Past Medical History:  Patient Active Problem List    Diagnosis Date Noted   ??? Urinary incontinence, male, stress 08/15/2016   ??? Prostate cancer (HCC) 07/19/2016     Past Medical History:   Diagnosis Date   ??? Aortic aneurysm (HCC) ~2013    Per patient very small   ??? BCC (basal cell carcinoma of skin) ~1990    Lip   ??? Dyslipidemia    ??? Elevated PSA    ??? Enlarged prostate    ??? Erectile dysfunction    ??? GERD (gastroesophageal reflux disease)    ??? Hypertension    ??? OSA on CPAP      Past Surgical History:   Procedure Laterality Date   ??? KNEE ARTHROSCOPY Right ~2010   ??? SKIN CANCER EXCISION  ~1990    BCC Lip         Prior Radiation History:   None    Medications  ??? ACETAMINOPHEN (TYLENOL PO) Take 1,000 mg by mouth daily as needed.   ??? aspirin EC 81 mg tablet Take 81 mg by mouth daily. Take with food.   ??? cholecalciferol (VITAMIN D-3) 1,000 units tablet Take 5,000 Units by mouth daily.   ??? FOLIC ACID/MULTIVIT-MIN/LUTEIN (CENTRUM SILVER PO) Take 1 tablet by mouth daily.   ??? hyoscyamine sulfate (LEVSIN/SL) 0.125 mg sublingual tablet Place  1 tablet under tongue every 4 hours as needed for Cramps. Bladder spasms   ??? lisinopril-hydrochlorothiazide (PRINZIDE, ZESTORETIC) 20-12.5 mg tablet Take 1 tablet by mouth every morning.   ??? loratadine (CLARITIN) 10 mg tablet Take 10 mg by mouth every morning.   ??? naproxen (NAPROSYN) 500 mg tablet Take 500 mg by mouth daily. Take with food.   ??? nitrofurantoin monohyd/m-cryst (MACROBID) 100 mg capsule Take 1 capsule by mouth every 12 hours. Take with food. Begin taking the day prior to catheter removal.   ??? omega 3-dha-epa-fish oil (FISH OIL) 100-160-1,000 mg cap Take 2 capsules by mouth daily.   ??? omeprazole DR(+) (PRILOSEC) 20 mg capsule Take 20 mg by mouth daily before breakfast.   ??? oxybutynin chloride (DITROPAN) 5 mg tablet Take 1 tablet by mouth three times daily as needed. Bladder spasms ??? oxyCODONE/acetaminophen (PERCOCET; ENDOCET; ROXICET) 5/325 mg tablet Take 1 tablet by mouth every 4 hours as needed   ??? polyethylene glycol 3350 (GLYCOLAX; MIRALAX) 17 gram/dose powder Take 17 g by mouth daily.   ??? polymyxin/bacitracin/neo/HC (CORTISPORIN) 1 % topical ointment Small amount to head of penis/catheter 3 times daily to reduce irritation.   ??? senna/docusate (SENOKOT-S) 8.6/50 mg tablet Take 1 tablet by mouth twice daily.   ??? simvastatin (ZOCOR) 40 mg tablet Take 20 mg by mouth at bedtime daily.   ??? vitamin E 400 unit capsule Take 400 Units by mouth daily.       Allergies:   Allergies   Allergen Reactions   ??? Ciprofloxacin HIVES and ITCHING   ??? Levaquin [Levofloxacin] RASH and ITCHING       Social History:  Social History     Socioeconomic History   ??? Marital status: Married     Spouse name: Not on file   ??? Number of children: Not on file   ??? Years of education: Not on file   ??? Highest education level: Not on file   Social Needs   ??? Financial resource strain: Not on file   ??? Food insecurity - worry: Not on file   ??? Food insecurity - inability: Not on file   ??? Transportation needs - medical: Not on file   ??? Transportation needs - non-medical: Not on file   Occupational History   ??? Not on file   Tobacco Use   ??? Smoking status: Former Smoker     Packs/day: 1.00     Years: 20.00     Pack years: 20.00     Types: Cigarettes     Last attempt to quit: 07/19/2001     Years since quitting: 15.9   ??? Smokeless tobacco: Never Used   Substance and Sexual Activity   ??? Alcohol use: No   ??? Drug use: No   ??? Sexual activity: Not on file   Other Topics Concern   ??? Not on file   Social History Narrative   ??? Not on file        Family History:  Family History   Problem Relation Age of Onset   ??? Cancer Mother    ??? Cancer Father    ??? Cancer Sister    ??? Diabetes Sister    ??? Cancer Brother    ??? Cancer-Prostate Brother    ??? Diabetes Brother        Review of Systems Patient Evaluated for a Clinical Trial: No treatment clinical trial available for this patient.    KARNOFSKY PERFORMANCE SCORE:  90% Able to carry on  normal activity; minor signs of disease   Physical Exam     Vitals:    06/05/17 1141   BP: 122/56   Pulse: 79   Temp: 36.7 ???C (98 ???F)   SpO2: 97%     GEN: NAD, AOx3  HEENT: PERRL, EMOi  CV: Normal S1, S2, no murmurs  Pulm: Lungs clear bilaterally  Abd: Soft, non-tender, non-distended  Ext: No clubbing, cyanosis, edema  MSK: No spine tenderness    LABORATORY:     Comprehensive Metabolic Profile    Lab Results   Component Value Date/Time    NA 137 08/03/2016 11:58 PM    K 3.7 08/03/2016 11:58 PM    CL 103 08/03/2016 11:58 PM    CO2 25 08/03/2016 11:58 PM    GAP 9 08/03/2016 11:58 PM    BUN 27 (H) 08/03/2016 11:58 PM    CR 1.02 08/03/2016 11:58 PM    GLU 157 (H) 08/03/2016 11:58 PM    Lab Results   Component Value Date/Time    CA 8.7 08/03/2016 11:58 PM    ALBUMIN 4.3 08/03/2016 03:55 PM    TOTPROT 7.8 08/03/2016 03:55 PM    ALKPHOS 43 08/03/2016 03:55 PM    AST 81 (H) 08/03/2016 03:55 PM    ALT 71 (H) 08/03/2016 03:55 PM    TOTBILI 0.9 08/03/2016 03:55 PM    GFR >60 08/03/2016 11:58 PM    GFRAA >60 08/03/2016 11:58 PM        CBC w diff    Lab Results   Component Value Date/Time    WBC 10.5 08/03/2016 11:58 PM    RBC 4.15 (L) 08/03/2016 11:58 PM    HGB 12.4 (L) 08/03/2016 11:58 PM    HCT 38.6 (L) 08/03/2016 11:58 PM    MCV 92.9 08/03/2016 11:58 PM    MCH 29.9 08/03/2016 11:58 PM    MCHC 32.2 08/03/2016 11:58 PM    RDW 14.0 08/03/2016 11:58 PM    PLTCT 216 08/03/2016 11:58 PM    MPV 7.5 08/03/2016 11:58 PM    Lab Results   Component Value Date/Time    NEUT 86 (H) 08/03/2016 11:58 PM    ANC 9.10 (H) 08/03/2016 11:58 PM    LYMA 9 (L) 08/03/2016 11:58 PM    ALC 0.90 (L) 08/03/2016 11:58 PM    MONA 5 08/03/2016 11:58 PM    AMC 0.50 08/03/2016 11:58 PM    EOSA 0 08/03/2016 11:58 PM    AEC 0.00 08/03/2016 11:58 PM    BASA 0 08/03/2016 11:58 PM    ABC 0.00 08/03/2016 11:58 PM IMAGING: N/A    PATHOLOGY: As above.  Pathology report in [facility] EMR.         ASSESSMENT: 72 y.o. male with pT3bN0M0 GS 7 (4+3 tertiary 5) PSA 4.5 (on finasteride), group 3B prostate cancer.  I discussed the natural history of recurrent prostate cancer as well as management options including observation, ADT, and salvage radiation +/- ADT.  He does not yet meet the AUA guideline criteria for biochemical recurrence of 0.2, though the persistent PSA and rising PSA after surgery is suggestive of a residual cancer.    Multiple institutional retrospective studies have demonstrated improved survival with salvage radiation therapy in patients with biochemical recurrence of prostate cancer after radical prostatectomy.  Typically, earlier initiation of salvage radiation is associated with improved biochemical control.  In the most recent update of the multi-institutional salvage radiation series, treatment at a PSA level of less than 0.1  was associated with improved biochemical control compared to greater than 0.1.      This patient has relatively good health, and I would recommend salvage radiation therapy.  Kenneth Macdonald wanted to hold off on any treatment at the current time.    If his PSA level reaches 0.1 at the next check, he would be eligible for the [facility] GU 006 clinical trial.    Kenneth Macdonald will follow up with Dr. Jimmey Ralph in 4 months.  I will reevaluate him at that time as well.  Anticipate that the PSA will be increased, and I plan to offer him salvage radiation therapy and participation in clinical trial.    Harriette Bouillon, MD  Attending Physician  Department of Radiation Oncology, [facility]    ATTESTATION    I personally performed the key portions of the E/M visit, discussed case with resident and concur with resident documentation of history, physical exam, assessment, and treatment plan unless otherwise noted.    Staff name:  Harriette Bouillon, MD Date:  06/18/2017 CC: A copy of this note has been sent to the referring provider, Dr. Ross Marcus, MD.

## 2017-07-16 ENCOUNTER — Encounter: Admit: 2017-07-16 | Discharge: 2017-07-16 | Payer: MEDICARE

## 2017-07-30 ENCOUNTER — Encounter: Admit: 2017-07-30 | Discharge: 2017-07-30 | Payer: MEDICARE

## 2017-07-30 DIAGNOSIS — Z8042 Family history of malignant neoplasm of prostate: ICD-10-CM

## 2017-07-30 DIAGNOSIS — C61 Malignant neoplasm of prostate: Principal | ICD-10-CM

## 2017-07-30 DIAGNOSIS — Z8041 Family history of malignant neoplasm of ovary: ICD-10-CM

## 2017-07-30 DIAGNOSIS — Z803 Family history of malignant neoplasm of breast: ICD-10-CM

## 2017-09-17 ENCOUNTER — Encounter: Admit: 2017-09-17 | Discharge: 2017-09-17 | Payer: MEDICARE

## 2017-09-17 DIAGNOSIS — C61 Malignant neoplasm of prostate: Principal | ICD-10-CM

## 2017-09-17 LAB — PROSTATIC SPECIFIC ANTIGEN-PSA: Lab: 0.1 ng/mL (ref <6.01)

## 2017-09-18 ENCOUNTER — Encounter: Admit: 2017-09-18 | Discharge: 2017-09-18 | Payer: MEDICARE

## 2017-09-18 ENCOUNTER — Ambulatory Visit: Admit: 2017-09-18 | Discharge: 2017-09-18 | Payer: MEDICARE

## 2017-09-18 DIAGNOSIS — C61 Malignant neoplasm of prostate: Principal | ICD-10-CM

## 2017-09-18 DIAGNOSIS — I719 Aortic aneurysm of unspecified site, without rupture: ICD-10-CM

## 2017-09-18 DIAGNOSIS — E785 Hyperlipidemia, unspecified: ICD-10-CM

## 2017-09-18 DIAGNOSIS — I10 Essential (primary) hypertension: ICD-10-CM

## 2017-09-18 DIAGNOSIS — G4733 Obstructive sleep apnea (adult) (pediatric): ICD-10-CM

## 2017-09-18 DIAGNOSIS — N400 Enlarged prostate without lower urinary tract symptoms: Principal | ICD-10-CM

## 2017-09-18 DIAGNOSIS — R972 Elevated prostate specific antigen [PSA]: ICD-10-CM

## 2017-09-18 DIAGNOSIS — C4491 Basal cell carcinoma of skin, unspecified: ICD-10-CM

## 2017-09-18 DIAGNOSIS — K219 Gastro-esophageal reflux disease without esophagitis: ICD-10-CM

## 2017-09-18 DIAGNOSIS — N529 Male erectile dysfunction, unspecified: ICD-10-CM

## 2017-09-19 ENCOUNTER — Encounter: Admit: 2017-09-19 | Discharge: 2017-09-19 | Payer: MEDICARE

## 2017-09-19 DIAGNOSIS — G4733 Obstructive sleep apnea (adult) (pediatric): ICD-10-CM

## 2017-09-19 DIAGNOSIS — N529 Male erectile dysfunction, unspecified: ICD-10-CM

## 2017-09-19 DIAGNOSIS — E785 Hyperlipidemia, unspecified: ICD-10-CM

## 2017-09-19 DIAGNOSIS — I10 Essential (primary) hypertension: ICD-10-CM

## 2017-09-19 DIAGNOSIS — C4491 Basal cell carcinoma of skin, unspecified: ICD-10-CM

## 2017-09-19 DIAGNOSIS — R972 Elevated prostate specific antigen [PSA]: ICD-10-CM

## 2017-09-19 DIAGNOSIS — K219 Gastro-esophageal reflux disease without esophagitis: ICD-10-CM

## 2017-09-19 DIAGNOSIS — N400 Enlarged prostate without lower urinary tract symptoms: Principal | ICD-10-CM

## 2017-09-19 DIAGNOSIS — I719 Aortic aneurysm of unspecified site, without rupture: ICD-10-CM

## 2017-09-30 ENCOUNTER — Ambulatory Visit: Admit: 2017-09-16 | Discharge: 2017-09-30 | Payer: MEDICARE

## 2017-09-30 DIAGNOSIS — C61 Malignant neoplasm of prostate: Principal | ICD-10-CM

## 2017-10-01 ENCOUNTER — Ambulatory Visit: Admit: 2017-10-01 | Discharge: 2017-10-15 | Payer: MEDICARE

## 2017-10-03 ENCOUNTER — Ambulatory Visit: Admit: 2017-10-03 | Discharge: 2017-10-04 | Payer: MEDICARE

## 2017-10-03 ENCOUNTER — Encounter: Admit: 2017-10-03 | Discharge: 2017-10-03 | Payer: MEDICARE

## 2017-10-03 ENCOUNTER — Ambulatory Visit: Admit: 2017-10-03 | Discharge: 2017-10-03 | Payer: MEDICARE

## 2017-10-03 DIAGNOSIS — C61 Malignant neoplasm of prostate: Principal | ICD-10-CM

## 2017-10-03 MED ORDER — SODIUM CHLORIDE 0.9 % IJ SOLN
50 mL | Freq: Once | INTRAVENOUS | 0 refills | Status: CP
Start: 2017-10-03 — End: ?
  Administered 2017-10-03: 22:00:00 50 mL via INTRAVENOUS

## 2017-10-03 MED ORDER — GADOBENATE DIMEGLUMINE 529 MG/ML (0.1MMOL/0.2ML) IV SOLN
20 mL | Freq: Once | INTRAVENOUS | 0 refills | Status: CP
Start: 2017-10-03 — End: ?
  Administered 2017-10-03: 22:00:00 20 mL via INTRAVENOUS

## 2017-10-09 ENCOUNTER — Encounter: Admit: 2017-10-09 | Discharge: 2017-10-09 | Payer: MEDICARE

## 2017-10-15 DIAGNOSIS — C61 Malignant neoplasm of prostate: Principal | ICD-10-CM

## 2017-10-16 ENCOUNTER — Encounter: Admit: 2017-10-16 | Discharge: 2017-10-16 | Payer: MEDICARE

## 2017-10-16 ENCOUNTER — Ambulatory Visit: Admit: 2017-10-16 | Discharge: 2017-10-30 | Payer: MEDICARE

## 2017-10-18 ENCOUNTER — Encounter: Admit: 2017-10-18 | Discharge: 2017-10-18 | Payer: MEDICARE

## 2017-10-19 ENCOUNTER — Encounter: Admit: 2017-10-19 | Discharge: 2017-10-19 | Payer: MEDICARE

## 2017-10-21 ENCOUNTER — Encounter: Admit: 2017-10-21 | Discharge: 2017-10-21 | Payer: MEDICARE

## 2017-10-21 ENCOUNTER — Ambulatory Visit: Admit: 2017-10-21 | Discharge: 2017-10-21 | Payer: MEDICARE

## 2017-10-22 ENCOUNTER — Encounter: Admit: 2017-10-22 | Discharge: 2017-10-22 | Payer: MEDICARE

## 2017-10-23 ENCOUNTER — Encounter: Admit: 2017-10-23 | Discharge: 2017-10-23 | Payer: MEDICARE

## 2017-10-24 ENCOUNTER — Encounter: Admit: 2017-10-24 | Discharge: 2017-10-24 | Payer: MEDICARE

## 2017-10-25 ENCOUNTER — Encounter: Admit: 2017-10-25 | Discharge: 2017-10-25 | Payer: MEDICARE

## 2017-10-28 ENCOUNTER — Encounter: Admit: 2017-10-28 | Discharge: 2017-10-28 | Payer: MEDICARE

## 2017-10-29 ENCOUNTER — Encounter: Admit: 2017-10-29 | Discharge: 2017-10-29 | Payer: MEDICARE

## 2017-10-30 ENCOUNTER — Encounter: Admit: 2017-10-30 | Discharge: 2017-10-30 | Payer: MEDICARE

## 2017-10-30 ENCOUNTER — Ambulatory Visit: Admit: 2017-10-30 | Discharge: 2017-10-30 | Payer: MEDICARE

## 2017-10-30 DIAGNOSIS — I10 Essential (primary) hypertension: ICD-10-CM

## 2017-10-30 DIAGNOSIS — I719 Aortic aneurysm of unspecified site, without rupture: ICD-10-CM

## 2017-10-30 DIAGNOSIS — K219 Gastro-esophageal reflux disease without esophagitis: ICD-10-CM

## 2017-10-30 DIAGNOSIS — C61 Malignant neoplasm of prostate: Principal | ICD-10-CM

## 2017-10-30 DIAGNOSIS — G4733 Obstructive sleep apnea (adult) (pediatric): ICD-10-CM

## 2017-10-30 DIAGNOSIS — E785 Hyperlipidemia, unspecified: ICD-10-CM

## 2017-10-30 DIAGNOSIS — R972 Elevated prostate specific antigen [PSA]: ICD-10-CM

## 2017-10-30 DIAGNOSIS — N529 Male erectile dysfunction, unspecified: ICD-10-CM

## 2017-10-30 DIAGNOSIS — C4491 Basal cell carcinoma of skin, unspecified: ICD-10-CM

## 2017-10-30 DIAGNOSIS — N400 Enlarged prostate without lower urinary tract symptoms: Principal | ICD-10-CM

## 2017-10-31 ENCOUNTER — Encounter: Admit: 2017-10-31 | Discharge: 2017-10-31 | Payer: MEDICARE

## 2017-10-31 ENCOUNTER — Ambulatory Visit: Admit: 2017-10-31 | Discharge: 2017-11-15 | Payer: MEDICARE

## 2017-11-01 ENCOUNTER — Encounter: Admit: 2017-11-01 | Discharge: 2017-11-01 | Payer: MEDICARE

## 2017-11-04 ENCOUNTER — Encounter: Admit: 2017-11-04 | Discharge: 2017-11-04 | Payer: MEDICARE

## 2017-11-04 ENCOUNTER — Ambulatory Visit: Admit: 2017-11-04 | Discharge: 2017-11-04 | Payer: MEDICARE

## 2017-11-04 DIAGNOSIS — K219 Gastro-esophageal reflux disease without esophagitis: ICD-10-CM

## 2017-11-04 DIAGNOSIS — 1 ERRONEOUS ENCOUNTER--DISREGARD: Principal | ICD-10-CM

## 2017-11-04 DIAGNOSIS — C61 Malignant neoplasm of prostate: Principal | ICD-10-CM

## 2017-11-04 DIAGNOSIS — R972 Elevated prostate specific antigen [PSA]: ICD-10-CM

## 2017-11-04 DIAGNOSIS — C4491 Basal cell carcinoma of skin, unspecified: ICD-10-CM

## 2017-11-04 DIAGNOSIS — E785 Hyperlipidemia, unspecified: ICD-10-CM

## 2017-11-04 DIAGNOSIS — N529 Male erectile dysfunction, unspecified: ICD-10-CM

## 2017-11-04 DIAGNOSIS — I10 Essential (primary) hypertension: ICD-10-CM

## 2017-11-04 DIAGNOSIS — N400 Enlarged prostate without lower urinary tract symptoms: Principal | ICD-10-CM

## 2017-11-04 DIAGNOSIS — I719 Aortic aneurysm of unspecified site, without rupture: ICD-10-CM

## 2017-11-04 DIAGNOSIS — G4733 Obstructive sleep apnea (adult) (pediatric): ICD-10-CM

## 2017-11-05 ENCOUNTER — Encounter: Admit: 2017-11-05 | Discharge: 2017-11-05 | Payer: MEDICARE

## 2017-11-06 ENCOUNTER — Encounter: Admit: 2017-11-06 | Discharge: 2017-11-06 | Payer: MEDICARE

## 2017-11-07 ENCOUNTER — Encounter: Admit: 2017-11-07 | Discharge: 2017-11-07 | Payer: MEDICARE

## 2017-11-08 ENCOUNTER — Encounter: Admit: 2017-11-08 | Discharge: 2017-11-08 | Payer: MEDICARE

## 2017-11-12 ENCOUNTER — Encounter: Admit: 2017-11-12 | Discharge: 2017-11-12 | Payer: MEDICARE

## 2017-11-12 ENCOUNTER — Ambulatory Visit: Admit: 2017-11-12 | Discharge: 2017-11-12 | Payer: MEDICARE

## 2017-11-12 DIAGNOSIS — N529 Male erectile dysfunction, unspecified: ICD-10-CM

## 2017-11-12 DIAGNOSIS — K219 Gastro-esophageal reflux disease without esophagitis: ICD-10-CM

## 2017-11-12 DIAGNOSIS — N400 Enlarged prostate without lower urinary tract symptoms: Principal | ICD-10-CM

## 2017-11-12 DIAGNOSIS — I10 Essential (primary) hypertension: ICD-10-CM

## 2017-11-12 DIAGNOSIS — C4491 Basal cell carcinoma of skin, unspecified: ICD-10-CM

## 2017-11-12 DIAGNOSIS — I719 Aortic aneurysm of unspecified site, without rupture: ICD-10-CM

## 2017-11-12 DIAGNOSIS — G4733 Obstructive sleep apnea (adult) (pediatric): ICD-10-CM

## 2017-11-12 DIAGNOSIS — E785 Hyperlipidemia, unspecified: ICD-10-CM

## 2017-11-12 DIAGNOSIS — R972 Elevated prostate specific antigen [PSA]: ICD-10-CM

## 2017-11-12 DIAGNOSIS — C61 Malignant neoplasm of prostate: Principal | ICD-10-CM

## 2017-11-13 ENCOUNTER — Encounter: Admit: 2017-11-13 | Discharge: 2017-11-13 | Payer: MEDICARE

## 2017-11-14 ENCOUNTER — Encounter: Admit: 2017-11-14 | Discharge: 2017-11-14 | Payer: MEDICARE

## 2017-11-15 ENCOUNTER — Encounter: Admit: 2017-11-15 | Discharge: 2017-11-15 | Payer: MEDICARE

## 2017-11-15 DIAGNOSIS — C61 Malignant neoplasm of prostate: Principal | ICD-10-CM

## 2017-11-16 ENCOUNTER — Ambulatory Visit: Admit: 2017-11-16 | Discharge: 2017-11-30 | Payer: MEDICARE

## 2017-11-18 ENCOUNTER — Ambulatory Visit: Admit: 2017-11-18 | Discharge: 2017-11-18 | Payer: MEDICARE

## 2017-11-18 ENCOUNTER — Encounter: Admit: 2017-11-18 | Discharge: 2017-11-18 | Payer: MEDICARE

## 2017-11-18 DIAGNOSIS — K219 Gastro-esophageal reflux disease without esophagitis: ICD-10-CM

## 2017-11-18 DIAGNOSIS — R972 Elevated prostate specific antigen [PSA]: ICD-10-CM

## 2017-11-18 DIAGNOSIS — I10 Essential (primary) hypertension: ICD-10-CM

## 2017-11-18 DIAGNOSIS — C4491 Basal cell carcinoma of skin, unspecified: ICD-10-CM

## 2017-11-18 DIAGNOSIS — E785 Hyperlipidemia, unspecified: ICD-10-CM

## 2017-11-18 DIAGNOSIS — N400 Enlarged prostate without lower urinary tract symptoms: Principal | ICD-10-CM

## 2017-11-18 DIAGNOSIS — I719 Aortic aneurysm of unspecified site, without rupture: ICD-10-CM

## 2017-11-18 DIAGNOSIS — G4733 Obstructive sleep apnea (adult) (pediatric): ICD-10-CM

## 2017-11-18 DIAGNOSIS — N529 Male erectile dysfunction, unspecified: ICD-10-CM

## 2017-11-19 ENCOUNTER — Encounter: Admit: 2017-11-19 | Discharge: 2017-11-19 | Payer: MEDICARE

## 2017-11-20 ENCOUNTER — Encounter: Admit: 2017-11-20 | Discharge: 2017-11-20 | Payer: MEDICARE

## 2017-11-21 ENCOUNTER — Encounter: Admit: 2017-11-21 | Discharge: 2017-11-21 | Payer: MEDICARE

## 2017-11-22 ENCOUNTER — Encounter: Admit: 2017-11-22 | Discharge: 2017-11-22 | Payer: MEDICARE

## 2017-11-25 ENCOUNTER — Ambulatory Visit: Admit: 2017-11-25 | Discharge: 2017-11-25 | Payer: MEDICARE

## 2017-11-25 ENCOUNTER — Encounter: Admit: 2017-11-25 | Discharge: 2017-11-25 | Payer: MEDICARE

## 2017-11-25 DIAGNOSIS — N400 Enlarged prostate without lower urinary tract symptoms: Principal | ICD-10-CM

## 2017-11-25 DIAGNOSIS — K219 Gastro-esophageal reflux disease without esophagitis: ICD-10-CM

## 2017-11-25 DIAGNOSIS — G4733 Obstructive sleep apnea (adult) (pediatric): ICD-10-CM

## 2017-11-25 DIAGNOSIS — E785 Hyperlipidemia, unspecified: ICD-10-CM

## 2017-11-25 DIAGNOSIS — I10 Essential (primary) hypertension: ICD-10-CM

## 2017-11-25 DIAGNOSIS — I719 Aortic aneurysm of unspecified site, without rupture: ICD-10-CM

## 2017-11-25 DIAGNOSIS — R972 Elevated prostate specific antigen [PSA]: ICD-10-CM

## 2017-11-25 DIAGNOSIS — C4491 Basal cell carcinoma of skin, unspecified: ICD-10-CM

## 2017-11-25 DIAGNOSIS — N529 Male erectile dysfunction, unspecified: ICD-10-CM

## 2017-11-26 ENCOUNTER — Encounter: Admit: 2017-11-26 | Discharge: 2017-11-26 | Payer: MEDICARE

## 2017-11-27 ENCOUNTER — Encounter: Admit: 2017-11-27 | Discharge: 2017-11-27 | Payer: MEDICARE

## 2017-11-28 ENCOUNTER — Encounter: Admit: 2017-11-28 | Discharge: 2017-11-28 | Payer: MEDICARE

## 2017-11-29 ENCOUNTER — Encounter: Admit: 2017-11-29 | Discharge: 2017-11-29 | Payer: MEDICARE

## 2017-11-30 DIAGNOSIS — C61 Malignant neoplasm of prostate: Principal | ICD-10-CM

## 2017-12-01 ENCOUNTER — Ambulatory Visit: Admit: 2017-12-01 | Discharge: 2017-12-15 | Payer: MEDICARE

## 2017-12-02 ENCOUNTER — Ambulatory Visit: Admit: 2017-12-02 | Discharge: 2017-12-02 | Payer: MEDICARE

## 2017-12-02 ENCOUNTER — Encounter: Admit: 2017-12-02 | Discharge: 2017-12-02 | Payer: MEDICARE

## 2017-12-02 DIAGNOSIS — C61 Malignant neoplasm of prostate: Principal | ICD-10-CM

## 2017-12-03 ENCOUNTER — Encounter: Admit: 2017-12-03 | Discharge: 2017-12-03 | Payer: MEDICARE

## 2017-12-04 ENCOUNTER — Encounter: Admit: 2017-12-04 | Discharge: 2017-12-04 | Payer: MEDICARE

## 2017-12-05 ENCOUNTER — Encounter: Admit: 2017-12-05 | Discharge: 2017-12-05 | Payer: MEDICARE

## 2017-12-05 DIAGNOSIS — Z923 Personal history of irradiation: Principal | ICD-10-CM

## 2017-12-15 DIAGNOSIS — C61 Malignant neoplasm of prostate: Principal | ICD-10-CM

## 2018-01-16 ENCOUNTER — Ambulatory Visit: Admit: 2018-01-16 | Discharge: 2018-01-30 | Payer: MEDICARE

## 2018-01-29 ENCOUNTER — Encounter: Admit: 2018-01-29 | Discharge: 2018-01-29 | Payer: MEDICARE

## 2018-01-29 ENCOUNTER — Ambulatory Visit: Admit: 2018-01-29 | Discharge: 2018-01-29 | Payer: MEDICARE

## 2018-01-29 DIAGNOSIS — N529 Male erectile dysfunction, unspecified: ICD-10-CM

## 2018-01-29 DIAGNOSIS — G4733 Obstructive sleep apnea (adult) (pediatric): ICD-10-CM

## 2018-01-29 DIAGNOSIS — I719 Aortic aneurysm of unspecified site, without rupture: ICD-10-CM

## 2018-01-29 DIAGNOSIS — N400 Enlarged prostate without lower urinary tract symptoms: Principal | ICD-10-CM

## 2018-01-29 DIAGNOSIS — R972 Elevated prostate specific antigen [PSA]: ICD-10-CM

## 2018-01-29 DIAGNOSIS — I10 Essential (primary) hypertension: ICD-10-CM

## 2018-01-29 DIAGNOSIS — C4491 Basal cell carcinoma of skin, unspecified: ICD-10-CM

## 2018-01-29 DIAGNOSIS — C61 Malignant neoplasm of prostate: Principal | ICD-10-CM

## 2018-01-29 DIAGNOSIS — K219 Gastro-esophageal reflux disease without esophagitis: ICD-10-CM

## 2018-01-29 DIAGNOSIS — E785 Hyperlipidemia, unspecified: ICD-10-CM

## 2018-01-29 LAB — PROSTATIC SPECIFIC ANTIGEN-PSA: Lab: 0 ng/mL (ref <6.01)

## 2018-01-30 DIAGNOSIS — C61 Malignant neoplasm of prostate: Principal | ICD-10-CM

## 2018-05-03 ENCOUNTER — Ambulatory Visit: Admit: 2018-05-03 | Discharge: 2018-05-17 | Payer: MEDICARE

## 2018-05-03 DIAGNOSIS — C61 Malignant neoplasm of prostate: Principal | ICD-10-CM

## 2018-05-06 ENCOUNTER — Encounter: Admit: 2018-05-06 | Discharge: 2018-05-06 | Payer: MEDICARE

## 2018-05-06 DIAGNOSIS — C61 Malignant neoplasm of prostate: Principal | ICD-10-CM

## 2018-05-06 LAB — PROSTATIC SPECIFIC ANTIGEN-PSA: Lab: 0 ng/mL (ref <6.01)

## 2018-05-07 ENCOUNTER — Ambulatory Visit: Admit: 2018-05-07 | Discharge: 2018-05-07 | Payer: MEDICARE

## 2018-05-17 DIAGNOSIS — C61 Malignant neoplasm of prostate: Principal | ICD-10-CM

## 2018-05-17 DIAGNOSIS — Z923 Personal history of irradiation: ICD-10-CM

## 2018-07-29 ENCOUNTER — Encounter: Admit: 2018-07-29 | Discharge: 2018-07-29 | Payer: MEDICARE

## 2018-07-29 DIAGNOSIS — C61 Malignant neoplasm of prostate: Principal | ICD-10-CM

## 2018-07-29 LAB — PROSTATIC SPECIFIC ANTIGEN-PSA: Lab: 0 ng/mL (ref <6.01)

## 2018-07-30 ENCOUNTER — Encounter: Admit: 2018-07-30 | Discharge: 2018-07-30 | Payer: MEDICARE

## 2018-07-30 DIAGNOSIS — I10 Essential (primary) hypertension: ICD-10-CM

## 2018-07-30 DIAGNOSIS — R972 Elevated prostate specific antigen [PSA]: ICD-10-CM

## 2018-07-30 DIAGNOSIS — N529 Male erectile dysfunction, unspecified: ICD-10-CM

## 2018-07-30 DIAGNOSIS — G4733 Obstructive sleep apnea (adult) (pediatric): ICD-10-CM

## 2018-07-30 DIAGNOSIS — E785 Hyperlipidemia, unspecified: ICD-10-CM

## 2018-07-30 DIAGNOSIS — N400 Enlarged prostate without lower urinary tract symptoms: Principal | ICD-10-CM

## 2018-07-30 DIAGNOSIS — I719 Aortic aneurysm of unspecified site, without rupture: ICD-10-CM

## 2018-07-30 DIAGNOSIS — K219 Gastro-esophageal reflux disease without esophagitis: ICD-10-CM

## 2018-07-30 DIAGNOSIS — C61 Malignant neoplasm of prostate: Principal | ICD-10-CM

## 2018-07-30 DIAGNOSIS — C4491 Basal cell carcinoma of skin, unspecified: ICD-10-CM

## 2018-11-01 ENCOUNTER — Ambulatory Visit: Admit: 2018-11-01 | Discharge: 2018-11-16 | Payer: MEDICARE

## 2018-11-06 ENCOUNTER — Encounter: Admit: 2018-11-06 | Discharge: 2018-11-06 | Payer: MEDICARE

## 2018-11-06 ENCOUNTER — Ambulatory Visit: Admit: 2018-11-06 | Discharge: 2018-11-06 | Payer: MEDICARE

## 2018-11-06 DIAGNOSIS — C61 Malignant neoplasm of prostate: Principal | ICD-10-CM

## 2018-11-06 LAB — PROSTATIC SPECIFIC ANTIGEN-PSA: Lab: 0 ng/mL (ref <6.01)

## 2018-11-06 NOTE — Progress Notes
Review of Systems   All other systems reviewed and are negative.

## 2018-11-16 DIAGNOSIS — C61 Malignant neoplasm of prostate: Principal | ICD-10-CM

## 2019-01-14 ENCOUNTER — Encounter: Admit: 2019-01-14 | Discharge: 2019-01-14

## 2019-01-28 ENCOUNTER — Encounter: Admit: 2019-01-28 | Discharge: 2019-01-28

## 2019-01-28 DIAGNOSIS — C61 Malignant neoplasm of prostate: Principal | ICD-10-CM

## 2019-01-28 DIAGNOSIS — C4491 Basal cell carcinoma of skin, unspecified: Secondary | ICD-10-CM

## 2019-01-28 DIAGNOSIS — E785 Hyperlipidemia, unspecified: Secondary | ICD-10-CM

## 2019-01-28 DIAGNOSIS — K219 Gastro-esophageal reflux disease without esophagitis: Secondary | ICD-10-CM

## 2019-01-28 DIAGNOSIS — N529 Male erectile dysfunction, unspecified: Secondary | ICD-10-CM

## 2019-01-28 DIAGNOSIS — R972 Elevated prostate specific antigen [PSA]: Secondary | ICD-10-CM

## 2019-01-28 DIAGNOSIS — I10 Essential (primary) hypertension: Secondary | ICD-10-CM

## 2019-01-28 DIAGNOSIS — G4733 Obstructive sleep apnea (adult) (pediatric): Secondary | ICD-10-CM

## 2019-01-28 DIAGNOSIS — N400 Enlarged prostate without lower urinary tract symptoms: Secondary | ICD-10-CM

## 2019-01-28 DIAGNOSIS — I719 Aortic aneurysm of unspecified site, without rupture: Secondary | ICD-10-CM

## 2019-01-28 LAB — PROSTATIC SPECIFIC ANTIGEN-PSA: Lab: 0.1 ng/mL (ref <6.01)

## 2019-01-28 NOTE — Progress Notes
Date of Service: 01/28/2019     Subjective:             Kenneth Macdonald is a 73 y.o. male.    Chief Complaint   Patient presents with   ??? Follow Up   ??? Prostate Cancer       Cancer Staging  Prostate cancer [facility])  Staging form: Prostate, AJCC 8th Edition  - Pathologic stage from 07/31/2016: Stage IIIB (pT3b, pN0, cM0, PSA: 4.5, Grade Group: 3) - Signed by Maryagnes Amos, PA-C on 02/19/2017      History of Present Illness  Mr. Kenneth Macdonald is a very pleasant 73 year old male, who underwent a robotic assisted laparoscopic prostatectomy, bilateral pelvic lymph node dissection, bilateral eye dissection, on 07/31/2016.  Pathology revealed pT3BN0M0, Gleason score 4+3 equal 7, grade group 3 disease.  Most recent PSA was drawn on 02/07/2019, PSA 0.11.  He has completed his postoperative radiation therapy, radiation occurred on 10/16/2017 through 12/05/2017, he received a total of 35 fractions.    He does have occasional urgency, states if he tries to hold his stream will have more leaking.  He does urinate every 2-3 hours daily.  He has nocturia 2-3 times.  He feels that he empties his bladder after urination.  He denies any hematuria or recent infections.  He describes his stream is a good full stream.  He denies any hesitancy or intermittent break in his stream.  He does have urinary leaking, more with activity, especially if he bends over.  He does use approximately 6 pads during the day.  He has previously seen pelvic floor physical therapy, he states it was hard for him to get there.  He has declined a future intervention for pelvic floor physical therapy at this time.           Review of Systems   Constitutional: Negative for activity change, appetite change, chills, diaphoresis, fatigue, fever and unexpected weight change.   HENT: Negative for congestion, hearing loss, mouth sores, rhinorrhea, sinus pressure, sore throat and trouble swallowing.    Eyes: Negative for discharge and visual disturbance. Respiratory: Negative for apnea, cough, chest tightness, shortness of breath and wheezing.    Cardiovascular: Negative for chest pain, palpitations and leg swelling.   Gastrointestinal: Negative for abdominal pain, blood in stool, constipation, diarrhea, nausea, rectal pain and vomiting.   Genitourinary: Negative for decreased urine volume, difficulty urinating, discharge, dysuria, enuresis, flank pain, frequency, genital sores, hematuria, penile pain, penile swelling, scrotal swelling, testicular pain and urgency.   Musculoskeletal: Negative for arthralgias, back pain, gait problem and myalgias.   Skin: Negative for rash and wound.   Neurological: Negative for dizziness, tremors, seizures, syncope, weakness, light-headedness, numbness and headaches.   Hematological: Negative for adenopathy. Does not bruise/bleed easily.   Psychiatric/Behavioral: Negative for agitation, behavioral problems, decreased concentration, dysphoric mood and sleep disturbance. The patient is not nervous/anxious.        Objective:         ??? ACETAMINOPHEN (TYLENOL PO) Take 1,000 mg by mouth daily as needed.   ??? aspirin EC 81 mg tablet Take 81 mg by mouth daily. Take with food.   ??? cholecalciferol (VITAMIN D-3) 1,000 units tablet Take 5,000 Units by mouth daily.   ??? FOLIC ACID/MULTIVIT-MIN/LUTEIN (CENTRUM SILVER PO) Take 1 tablet by mouth daily.   ??? hyoscyamine sulfate (LEVSIN/SL) 0.125 mg sublingual tablet Place 1 tablet under tongue every 4 hours as needed for Cramps. Bladder spasms   ??? lisinopril-hydrochlorothiazide (PRINZIDE, ZESTORETIC) 20-12.5  mg tablet Take 1 tablet by mouth every morning.   ??? loratadine (CLARITIN) 10 mg tablet Take 10 mg by mouth every morning.   ??? naproxen (NAPROSYN) 500 mg tablet Take 500 mg by mouth daily. Take with food.   ??? nitrofurantoin monohyd/m-cryst (MACROBID) 100 mg capsule Take 1 capsule by mouth every 12 hours. Take with food. Begin taking the day prior to catheter removal. ??? omeprazole DR(+) (PRILOSEC) 20 mg capsule Take 20 mg by mouth daily before breakfast.   ??? oxybutynin chloride (DITROPAN) 5 mg tablet Take 1 tablet by mouth three times daily as needed. Bladder spasms   ??? oxyCODONE/acetaminophen (PERCOCET; ENDOCET; ROXICET) 5/325 mg tablet Take 1 tablet by mouth every 4 hours as needed   ??? polyethylene glycol 3350 (GLYCOLAX; MIRALAX) 17 gram/dose powder Take 17 g by mouth daily.   ??? polymyxin/bacitracin/neo/HC (CORTISPORIN) 1 % topical ointment Small amount to head of penis/catheter 3 times daily to reduce irritation.   ??? senna/docusate (SENOKOT-S) 8.6/50 mg tablet Take 1 tablet by mouth twice daily.   ??? simvastatin (ZOCOR) 40 mg tablet Take 80 mg by mouth at bedtime daily.   ??? vitamin E 400 unit capsule Take 400 Units by mouth daily.       Vitals:    01/28/19 1308 01/28/19 1312   BP: (!) 148/76    BP Source: Arm, Left Upper    Patient Position: Sitting    Pulse: 71    Resp: 18    Temp: 36.7 ???C (98 ???F)    TempSrc: Oral    SpO2: 96%    Weight: 134.3 kg (296 lb)    Height: 185.4 cm (72.99)    PainSc:  Zero       Body mass index is 39.06 kg/m???.     Labs:  [place] PSA Hx:  Lab Results   Component Value Date    PSA 0.11 01/28/2019    PSA 0.09 11/06/2018    PSA 0.08 07/29/2018    PSA 0.08 05/06/2018    PSA 0.07 01/29/2018    PSA 0.11 09/17/2017    PSA 0.09 06/04/2017    PSA 0.08 02/19/2017    PSA 0.06 11/13/2016         Physical Exam  Vitals signs reviewed.   Constitutional:       General: He is not in acute distress.     Appearance: Normal appearance. He is not ill-appearing.   HENT:      Head: Normocephalic and atraumatic.   Eyes:      General: No scleral icterus.     Extraocular Movements: Extraocular movements intact.      Conjunctiva/sclera: Conjunctivae normal.   Neck:      Musculoskeletal: Normal range of motion.   Pulmonary:      Effort: Pulmonary effort is normal. No respiratory distress.   Abdominal:      General: There is no distension.      Palpations: Abdomen is soft. Musculoskeletal: Normal range of motion.         General: No swelling.   Skin:     General: Skin is warm and dry.   Neurological:      Mental Status: He is alert and oriented to person, place, and time.   Psychiatric:         Mood and Affect: Mood normal.         Behavior: Behavior normal.         Thought Content: Thought content normal.  Judgment: Judgment normal.           Assessment and Plan:  Problem   Prostate Cancer (Hcc)    Fusion biopsy 06/10/16  Gleason 3+3=6 right prostate 1/6  Gleason 3+4=7 left prostate involving  4/6 cores    07/31/2016: RALP, BPLND, BWD Gleason 4+3=7 with tertiary 5, pT3aN0 (0/29)MxR1 (2mm pattern 4 and 5 at margin).    Labs:  [place] PSA Hx:  Lab Results   Component Value Date    PSA 0.11 01/28/2019    PSA 0.09 11/06/2018    PSA 0.08 07/29/2018    PSA 0.08 05/06/2018    PSA 0.07 01/29/2018    PSA 0.11 09/17/2017    PSA 0.09 06/04/2017    PSA 0.08 02/19/2017    PSA 0.06 11/13/2016            Prostate cancer (HCC)  I had the pleasure of visiting with Mr. Kenneth Macdonald in clinic today.  He underwent a robotic assisted laparoscopic prostatectomy, bilateral pelvic lymph node dissection, bilateral wide dissection, on 07/31/2016.  He completed postoperative radiation therapy with Dr. Flonnie Hailstone, radiation occurred on 10/16/2017 through 12/05/2017.  He received a total of 35 fractions.  Most recent PSA was drawn today, PSA was 0.11, slight increase from previous value 3 months ago.    He is still having urinary incontinence.  He does use approximately 6 pads during the day.  He has previously been to pelvic floor physical therapy, he states it was just hard for him to get there.  He has declined future intervention for pelvic floor physical therapy at this time.  He feels he is doing okay.    His PSA has increased slightly, I advised that I will consult with Dr. Jimmey Ralph.  He was agreeable to this plan.    1340--Dr. Jimmey Ralph responded to my message regarding an increase in his PSA. Dr. Jimmey Ralph stated that he has had maximum therapy at radiation and surgery, he does not generally recommend intervention until his PSA approaches 0.5, at which time we can get a PET/CT to evaluate for any sites of disease.  1545--I spoke with Mr. Kenneth Macdonald and his daughter regarding Dr. Rhys Martini recommendations and return visit. They verbalized understanding.     Mr. Kenneth Macdonald sees Dr. Flonnie Hailstone in November 2020, Dr. Jimmey Ralph said we can see him back in 6 months and alternate our appointments with Dr. Flonnie Hailstone.     Plan;  1.  RTC in 6 months with a PSA  2.  Offered pelvic floor physical therapy, he declined intervention at this time    Orders Placed This Encounter   ??? PROSTATIC SPECIFIC ANTIGEN-PSA       [place], APRN-NP  Urology

## 2019-02-06 ENCOUNTER — Encounter: Admit: 2019-02-06 | Discharge: 2019-02-06

## 2019-02-06 NOTE — Telephone Encounter
Patient called asking about his next PSA lab appointment.  Returned patient phone call and left message with return phone number on voicemail.

## 2019-05-04 ENCOUNTER — Encounter: Admit: 2019-05-04 | Discharge: 2019-05-04 | Payer: MEDICARE

## 2019-05-04 DIAGNOSIS — C61 Malignant neoplasm of prostate: Secondary | ICD-10-CM

## 2019-05-08 ENCOUNTER — Encounter: Admit: 2019-05-08 | Discharge: 2019-05-08 | Payer: MEDICARE

## 2019-05-08 ENCOUNTER — Ambulatory Visit: Admit: 2019-05-08 | Discharge: 2019-05-08 | Payer: MEDICARE

## 2019-05-08 DIAGNOSIS — R972 Elevated prostate specific antigen [PSA]: Secondary | ICD-10-CM

## 2019-05-08 DIAGNOSIS — K219 Gastro-esophageal reflux disease without esophagitis: Secondary | ICD-10-CM

## 2019-05-08 DIAGNOSIS — C61 Malignant neoplasm of prostate: Secondary | ICD-10-CM

## 2019-05-08 DIAGNOSIS — E785 Hyperlipidemia, unspecified: Secondary | ICD-10-CM

## 2019-05-08 DIAGNOSIS — N529 Male erectile dysfunction, unspecified: Secondary | ICD-10-CM

## 2019-05-08 DIAGNOSIS — I719 Aortic aneurysm of unspecified site, without rupture: Secondary | ICD-10-CM

## 2019-05-08 DIAGNOSIS — G4733 Obstructive sleep apnea (adult) (pediatric): Secondary | ICD-10-CM

## 2019-05-08 DIAGNOSIS — N400 Enlarged prostate without lower urinary tract symptoms: Secondary | ICD-10-CM

## 2019-05-08 DIAGNOSIS — C4491 Basal cell carcinoma of skin, unspecified: Secondary | ICD-10-CM

## 2019-05-08 DIAGNOSIS — I10 Essential (primary) hypertension: Secondary | ICD-10-CM

## 2019-05-08 NOTE — Progress Notes
Review of Systems   Constitutional: Negative.    HENT:  Negative.    Eyes: Negative.    Respiratory: Negative.    Cardiovascular: Negative.    Gastrointestinal: Negative.    Endocrine: Negative.    Genitourinary: Positive for frequency.         Only at night    Musculoskeletal: Negative.    Skin: Negative.    Neurological: Negative.    Hematological: Negative.    Psychiatric/Behavioral: Negative.

## 2019-05-08 NOTE — Progress Notes
Obtained patient's verbal consent to treat them and their agreement to [facility] financial policy and NPP via this telehealth visit during the [facility] Emergency      Radiation Oncology Follow Up Note   Date: 05/08/2019       Andrus Sharp is a 73 y.o. male.     There were no encounter diagnoses.  Staging: Cancer Staging  Prostate cancer [facility])  Staging form: Prostate, AJCC 8th Edition  - Pathologic stage from 07/31/2016: Stage IIIB (pT3b, pN0, cM0, PSA: 4.5, Grade Group: 3) - Signed by Maryagnes Amos, PA-C on 02/19/2017        Subjective:          Cancer Staging  Prostate cancer [facility])  Staging form: Prostate, AJCC 8th Edition  - Pathologic stage from 07/31/2016: Stage IIIB (pT3b, pN0, cM0, PSA: 4.5, Grade Group: 3) - Signed by Maryagnes Amos, PA-C on 02/19/2017    Site Treatment Dates Technique Energy Dose/?Fraction (cGy) Total Dose (cGy) Fractions Missed Treatment Days   Prostate bed 10/16/17 - 12/05/17 IMRT 6X 200 [id number] 2   Concurrent systemic therapy:??None  ?    History of Present Illness    Health has been good.  He had anemia and found to have low iron, and recent EGD.  The iron is being replaced and improving.  Incontinence is unchanged.  He has had no bowel issues.       Review of Systems  Per nursing note    Objective:         ? ACETAMINOPHEN (TYLENOL PO) Take 1,000 mg by mouth daily as needed.   ? aspirin EC 81 mg tablet Take 81 mg by mouth daily. Take with food.   ? cholecalciferol (VITAMIN D-3) 1,000 units tablet Take 5,000 Units by mouth daily.   ? FOLIC ACID/MULTIVIT-MIN/LUTEIN (CENTRUM SILVER PO) Take 1 tablet by mouth daily.   ? hyoscyamine sulfate (LEVSIN/SL) 0.125 mg sublingual tablet Place 1 tablet under tongue every 4 hours as needed for Cramps. Bladder spasms   ? lisinopril-hydrochlorothiazide (PRINZIDE, ZESTORETIC) 20-12.5 mg tablet Take 1 tablet by mouth every morning.   ? loratadine (CLARITIN) 10 mg tablet Take 10 mg by mouth every morning. ? naproxen (NAPROSYN) 500 mg tablet Take 500 mg by mouth daily. Take with food.   ? nitrofurantoin monohyd/m-cryst (MACROBID) 100 mg capsule Take 1 capsule by mouth every 12 hours. Take with food. Begin taking the day prior to catheter removal.   ? omeprazole DR(+) (PRILOSEC) 20 mg capsule Take 20 mg by mouth daily before breakfast.   ? oxybutynin chloride (DITROPAN) 5 mg tablet Take 1 tablet by mouth three times daily as needed. Bladder spasms   ? oxyCODONE/acetaminophen (PERCOCET; ENDOCET; ROXICET) 5/325 mg tablet Take 1 tablet by mouth every 4 hours as needed   ? polyethylene glycol 3350 (GLYCOLAX; MIRALAX) 17 gram/dose powder Take 17 g by mouth daily.   ? polymyxin/bacitracin/neo/HC (CORTISPORIN) 1 % topical ointment Small amount to head of penis/catheter 3 times daily to reduce irritation.   ? senna/docusate (SENOKOT-S) 8.6/50 mg tablet Take 1 tablet by mouth twice daily.   ? simvastatin (ZOCOR) 40 mg tablet Take 80 mg by mouth at bedtime daily.   ? vitamin E 400 unit capsule Take 400 Units by mouth daily.     Vitals:    05/08/19 1217   BP: 136/80   Pulse: 82   Weight: 131.5 kg (290 lb)   PainSc: Zero     Body mass index is 38.27 kg/m?Marland Kitchen  Pain Score: Zero        Fatigue Scale: 0-None    KARNOFSKY PERFORMANCE SCORE:  90% Able to carry on normal activity; minor signs of disease     Physical Exam       Lab Results   Component Value Date    PSA 0.11 01/28/2019    PSA 0.09 11/06/2018    PSA 0.08 07/29/2018    PSA 0.08 05/06/2018    PSA 0.07 01/29/2018    PSA 0.11 09/17/2017    PSA 0.09 06/04/2017    PSA 0.08 02/19/2017    PSA 0.06 11/13/2016     OSH PSA 04/2019: 0.10       Assessment and Plan:  Mr. Kenneth Macdonald is a 73 year old man with?pT3bN0M0 GS 7 (4+3 tertiary 5) PSA 4.5 (on finasteride), group?3B?prostate cancer?s/p radical prostatectomy?07/2016. PSA?subsequently?increased to 0.11.???He was treated with a course of salvage radiation therapy to the prostate bed completed 12/05/2017. ?    PSA generally stable at 0.10. I recommend to continue to monitor.  I will have Mr. Rouland follow up with me in 6 months with PSA check.  I tentatively would plan to check a PET scan when PSA reaches about 0.5.    Harriette Bouillon, MD  Attending Physician  Department of Radiation Oncology, [facility]    ATTESTATION    I personally performed the E/M including history, physical exam, and MDM.    Staff name:  Harriette Bouillon, MD Date:  05/08/2019     Time: 5 min

## 2019-09-04 ENCOUNTER — Encounter: Admit: 2019-09-04 | Discharge: 2019-09-04 | Payer: MEDICARE

## 2019-09-04 DIAGNOSIS — K219 Gastro-esophageal reflux disease without esophagitis: Secondary | ICD-10-CM

## 2019-09-04 DIAGNOSIS — E785 Hyperlipidemia, unspecified: Secondary | ICD-10-CM

## 2019-09-04 DIAGNOSIS — I719 Aortic aneurysm of unspecified site, without rupture: Secondary | ICD-10-CM

## 2019-09-04 DIAGNOSIS — C61 Malignant neoplasm of prostate: Secondary | ICD-10-CM

## 2019-09-04 DIAGNOSIS — N529 Male erectile dysfunction, unspecified: Secondary | ICD-10-CM

## 2019-09-04 DIAGNOSIS — C4491 Basal cell carcinoma of skin, unspecified: Secondary | ICD-10-CM

## 2019-09-04 DIAGNOSIS — R972 Elevated prostate specific antigen [PSA]: Secondary | ICD-10-CM

## 2019-09-04 DIAGNOSIS — I10 Essential (primary) hypertension: Secondary | ICD-10-CM

## 2019-09-04 DIAGNOSIS — G4733 Obstructive sleep apnea (adult) (pediatric): Secondary | ICD-10-CM

## 2019-09-04 DIAGNOSIS — N400 Enlarged prostate without lower urinary tract symptoms: Secondary | ICD-10-CM

## 2019-09-04 NOTE — Progress Notes
Telehealth Visit Note    Date of Service: 09/04/2019    Subjective:      Obtained patient's verbal consent to treat them and their agreement to [facility] financial policy and NPP via this telehealth visit during the [facility] Emergency       Kenneth Macdonald is a 74 y.o. male.    History of Present Illness  Kenneth Macdonald is a very pleasant 74 y.o. man who underwent a anterior prostatectomy with bilateral wide dissection and bilateral pelvic node dissection on 07/31/2016.  His pathology was notable for grade group 3, pT3aN0MxR1 disease.  He is not continent and uses 6 pads per day.  He is trying to lose weight for this.  He is otherwise in his usual state of health.           Review of Systems      Objective:         ? ACETAMINOPHEN (TYLENOL PO) Take 1,000 mg by mouth daily as needed.   ? aspirin EC 81 mg tablet Take 81 mg by mouth daily. Take with food.   ? cholecalciferol (VITAMIN D-3) 1,000 units tablet Take 5,000 Units by mouth daily.   ? FOLIC ACID/MULTIVIT-MIN/LUTEIN (CENTRUM SILVER PO) Take 1 tablet by mouth daily.   ? hyoscyamine sulfate (LEVSIN/SL) 0.125 mg sublingual tablet Place 1 tablet under tongue every 4 hours as needed for Cramps. Bladder spasms   ? lisinopril-hydrochlorothiazide (PRINZIDE, ZESTORETIC) 20-12.5 mg tablet Take 1 tablet by mouth every morning.   ? loratadine (CLARITIN) 10 mg tablet Take 10 mg by mouth every morning.   ? naproxen (NAPROSYN) 500 mg tablet Take 500 mg by mouth daily. Take with food.   ? nitrofurantoin monohyd/m-cryst (MACROBID) 100 mg capsule Take 1 capsule by mouth every 12 hours. Take with food. Begin taking the day prior to catheter removal.   ? omeprazole DR(+) (PRILOSEC) 20 mg capsule Take 20 mg by mouth daily before breakfast.   ? oxybutynin chloride (DITROPAN) 5 mg tablet Take 1 tablet by mouth three times daily as needed. Bladder spasms   ? oxyCODONE/acetaminophen (PERCOCET; ENDOCET; ROXICET) 5/325 mg tablet Take 1 tablet by mouth every 4 hours as needed   ? polyethylene glycol 3350 (GLYCOLAX; MIRALAX) 17 gram/dose powder Take 17 g by mouth daily.   ? polymyxin/bacitracin/neo/HC (CORTISPORIN) 1 % topical ointment Small amount to head of penis/catheter 3 times daily to reduce irritation.   ? senna/docusate (SENOKOT-S) 8.6/50 mg tablet Take 1 tablet by mouth twice daily.   ? simvastatin (ZOCOR) 40 mg tablet Take 80 mg by mouth at bedtime daily.   ? vitamin E 400 unit capsule Take 400 Units by mouth daily.     Vitals:    09/04/19 1247   PainSc: Zero     There is no height or weight on file to calculate BMI.     Telehealth Patient Reported Vitals     Row Name 09/04/19 1247                Pain Score  Zero              Physical Exam         Assessment and Plan:    Problem   Prostate Cancer (Hcc)    Fusion biopsy 06/10/16  Gleason 3+3=6 right prostate 1/6  Gleason 3+4=7 left prostate involving  4/6 cores    07/31/2016: RALP, BPLND, BWD Gleason 4+3=7 with tertiary 5, pT3aN0 (0/29)MxR1 (2mm pattern 4 and 5 at  margin).    Labs:  [place] PSA Hx:  Lab Results   Component Value Date    PSA 0.11 01/28/2019    PSA 0.09 11/06/2018    PSA 0.08 07/29/2018    PSA 0.08 05/06/2018    PSA 0.07 01/29/2018    PSA 0.11 09/17/2017    PSA 0.09 06/04/2017    PSA 0.08 02/19/2017    PSA 0.06 11/13/2016            Prostate cancer (HCC)  I had the pleasure of visiting with Kenneth Macdonald via [facility] today. I discussed options for his incontinence including PFPT versus weight loss for his incontinence.  We will track down his PSA and will call him with the result - 0.17 obtained after the visit.  I reviewed the timeline for a PET/CT when the PSA reaches 0.5.  He was in agreement.      Plan:  1.  PSA today was 0.17  2.  RTC in 6 months with a PSA           20 minutes spent on this patient's encounter with counseling and coordination of care taking >50% of the visit.

## 2019-09-04 NOTE — Assessment & Plan Note
I had the pleasure of visiting with Mr. Kenneth Macdonald via [facility] today. I discussed options for his incontinence including PFPT versus weight loss for his incontinence.  We will track down his PSA and will call him with the result.  I reviewed the timeline for a PET/CT when the PSA reaches 0.5.  He was in agreement.      Plan:  1.  PSA   2.  RTC in 6 months with a PSA

## 2019-09-17 ENCOUNTER — Encounter: Admit: 2019-09-17 | Discharge: 2019-09-17 | Payer: MEDICARE

## 2019-09-17 DIAGNOSIS — C61 Malignant neoplasm of prostate: Secondary | ICD-10-CM

## 2019-10-28 ENCOUNTER — Encounter: Admit: 2019-10-28 | Discharge: 2019-10-28 | Payer: MEDICARE

## 2019-10-28 DIAGNOSIS — C61 Malignant neoplasm of prostate: Secondary | ICD-10-CM

## 2019-11-16 NOTE — Progress Notes
Radiation Oncology Follow Up Note   Date: 11/19/2019       Kenneth Macdonald is a 74 y.o. male.     There were no encounter diagnoses.  Staging: Cancer Staging  Prostate cancer [facility])  Staging form: Prostate, AJCC 8th Edition  - Pathologic stage from 07/31/2016: Stage IIIB (pT3b, pN0, cM0, PSA: 4.5, Grade Group: 3) - Signed by Maryagnes Amos, PA-C on 02/19/2017        Subjective:          Cancer Staging  Prostate cancer [facility])  Staging form: Prostate, AJCC 8th Edition  - Pathologic stage from 07/31/2016: Stage IIIB (pT3b, pN0, cM0, PSA: 4.5, Grade Group: 3) - Signed by Maryagnes Amos, PA-C on 02/19/2017    Site Treatment Dates Technique Energy Dose/?Fraction (cGy) Total Dose (cGy) Fractions Missed Treatment Days   Prostate bed 10/16/17 - 12/05/17 IMRT 6X 200 7046 35 2   Concurrent systemic therapy:??None  ?      History of Present Illness  Kenneth Macdonald returns today for follow-up.  He was anemic and this improved with iron pills.  He had EGD and colonoscopy and found some varicose veins which will be cauterized.       Review of Systems  Per nursing note    Objective:         ? ACETAMINOPHEN (TYLENOL PO) Take 1,000 mg by mouth daily as needed.   ? aspirin EC 81 mg tablet Take 81 mg by mouth daily. Take with food.   ? cholecalciferol (VITAMIN D-3) 1,000 units tablet Take 5,000 Units by mouth daily.   ? FOLIC ACID/MULTIVIT-MIN/LUTEIN (CENTRUM SILVER PO) Take 1 tablet by mouth daily.   ? hyoscyamine sulfate (LEVSIN/SL) 0.125 mg sublingual tablet Place 1 tablet under tongue every 4 hours as needed for Cramps. Bladder spasms   ? lisinopril-hydrochlorothiazide (PRINZIDE, ZESTORETIC) 20-12.5 mg tablet Take 1 tablet by mouth every morning.   ? loratadine (CLARITIN) 10 mg tablet Take 10 mg by mouth every morning.   ? naproxen (NAPROSYN) 500 mg tablet Take 500 mg by mouth daily. Take with food.   ? nitrofurantoin monohyd/m-cryst (MACROBID) 100 mg capsule Take 1 capsule by mouth every 12 hours. Take with food. Begin taking the day prior to catheter removal.   ? omeprazole DR(+) (PRILOSEC) 20 mg capsule Take 20 mg by mouth daily before breakfast.   ? oxybutynin chloride (DITROPAN) 5 mg tablet Take 1 tablet by mouth three times daily as needed. Bladder spasms   ? oxyCODONE/acetaminophen (PERCOCET; ENDOCET; ROXICET) 5/325 mg tablet Take 1 tablet by mouth every 4 hours as needed   ? polyethylene glycol 3350 (GLYCOLAX; MIRALAX) 17 gram/dose powder Take 17 g by mouth daily.   ? polymyxin/bacitracin/neo/HC (CORTISPORIN) 1 % topical ointment Small amount to head of penis/catheter 3 times daily to reduce irritation.   ? senna/docusate (SENOKOT-S) 8.6/50 mg tablet Take 1 tablet by mouth twice daily.   ? simvastatin (ZOCOR) 40 mg tablet Take 80 mg by mouth at bedtime daily.   ? vitamin E 400 unit capsule Take 400 Units by mouth daily.     There were no vitals filed for this visit.  There is no height or weight on file to calculate BMI.                   KARNOFSKY PERFORMANCE SCORE:  90% Able to carry on normal activity; minor signs of disease     Physical Exam     Vitals:    11/19/19 1103  BP: 137/55   Pulse: 88   Temp: 36.6 ?C (97.8 ?F)   SpO2: 96%     NAD, AOx3  Abdomen soft    Lab Results   Component Value Date    PSA 0.14 11/18/2019    PSA 0.11 01/28/2019    PSA 0.09 11/06/2018    PSA 0.08 07/29/2018    PSA 0.08 05/06/2018    PSA 0.07 01/29/2018    PSA 0.11 09/17/2017    PSA 0.09 06/04/2017    PSA 0.08 02/19/2017    PSA 0.06 11/13/2016       08/26/19: 0.17 (OSH)     Assessment and Plan:  Kenneth Macdonald is a 74 year old man with?pT3bN0M0 GS 7 (4+3 tertiary 5) PSA 4.5 (on finasteride), group?3B?prostate cancer?s/p radical prostatectomy?07/2016. PSA?subsequently?increased to 0.11.???He was treated with a course of salvage radiation therapy to the prostate bed completed 12/05/2017.  His PSA since completion of salvage radiation has been increasing, now up to 0.14.    Patient has rising PSA since completion of salvage radiation therapy.  I think he has also possibly experienced a toxicity with some rectal bleeding, though I do not have the records to confirm this.  Sounds like he is going to get APC for the bleeding.    His PSA is rising, though quite slowly.  He has a PSA doubling time of about 2 years.  I think he has residual prostate cancer, but the growth of it is indolent.    I recommend to continue monitor the PSA.  I recommend to get a PET scan when PSA reaches 0.2-0.5.    I will have Kenneth Macdonald follow-up with me in 6 months.    Harriette Bouillon, MD  Attending Physician  Department of Radiation Oncology, [facility]  ATTESTATION    I personally performed the E/M including history, physical exam, and MDM.      Staff name:  Harriette Bouillon, MD Date:  11/19/2019  Total time including obtaining and review of records, patient face to face time, order entry, placing referrals, and documentation: 20 min

## 2019-11-17 ENCOUNTER — Ambulatory Visit: Admit: 2019-11-17 | Discharge: 2019-11-17 | Payer: MEDICARE

## 2019-11-18 ENCOUNTER — Encounter: Admit: 2019-11-18 | Discharge: 2019-11-18 | Payer: MEDICARE

## 2019-11-18 DIAGNOSIS — C61 Malignant neoplasm of prostate: Secondary | ICD-10-CM

## 2019-11-18 LAB — PROSTATIC SPECIFIC ANTIGEN-PSA: Lab: 0.1 ng/mL (ref <6.01)

## 2019-11-19 ENCOUNTER — Encounter: Admit: 2019-11-19 | Discharge: 2019-11-19 | Payer: MEDICARE

## 2019-11-19 ENCOUNTER — Ambulatory Visit: Admit: 2019-11-19 | Discharge: 2019-11-19 | Payer: MEDICARE

## 2019-11-19 DIAGNOSIS — C4491 Basal cell carcinoma of skin, unspecified: Secondary | ICD-10-CM

## 2019-11-19 DIAGNOSIS — N400 Enlarged prostate without lower urinary tract symptoms: Secondary | ICD-10-CM

## 2019-11-19 DIAGNOSIS — G4733 Obstructive sleep apnea (adult) (pediatric): Secondary | ICD-10-CM

## 2019-11-19 DIAGNOSIS — R972 Elevated prostate specific antigen [PSA]: Secondary | ICD-10-CM

## 2019-11-19 DIAGNOSIS — E785 Hyperlipidemia, unspecified: Secondary | ICD-10-CM

## 2019-11-19 DIAGNOSIS — N529 Male erectile dysfunction, unspecified: Secondary | ICD-10-CM

## 2019-11-19 DIAGNOSIS — C61 Malignant neoplasm of prostate: Secondary | ICD-10-CM

## 2019-11-19 DIAGNOSIS — I10 Essential (primary) hypertension: Secondary | ICD-10-CM

## 2019-11-19 DIAGNOSIS — K219 Gastro-esophageal reflux disease without esophagitis: Secondary | ICD-10-CM

## 2019-11-19 DIAGNOSIS — I719 Aortic aneurysm of unspecified site, without rupture: Secondary | ICD-10-CM

## 2019-11-19 NOTE — Progress Notes
Review of Systems   Constitutional: Negative.    HENT: Negative.    Eyes: Negative.    Respiratory: Negative.    Cardiovascular: Negative.    Gastrointestinal: Negative.    Endocrine: Negative.    Genitourinary: Negative.    Musculoskeletal: Negative.    Skin: Negative.    Allergic/Immunologic: Negative.    Neurological: Negative.    Hematological: Negative.    Psychiatric/Behavioral: Negative.

## 2020-02-29 ENCOUNTER — Encounter: Admit: 2020-02-29 | Discharge: 2020-02-29 | Payer: MEDICARE

## 2020-02-29 DIAGNOSIS — C61 Malignant neoplasm of prostate: Secondary | ICD-10-CM

## 2020-03-11 ENCOUNTER — Encounter: Admit: 2020-03-11 | Discharge: 2020-03-11 | Payer: MEDICARE

## 2020-03-11 DIAGNOSIS — K219 Gastro-esophageal reflux disease without esophagitis: Secondary | ICD-10-CM

## 2020-03-11 DIAGNOSIS — C61 Malignant neoplasm of prostate: Secondary | ICD-10-CM

## 2020-03-11 DIAGNOSIS — G4733 Obstructive sleep apnea (adult) (pediatric): Secondary | ICD-10-CM

## 2020-03-11 DIAGNOSIS — C4491 Basal cell carcinoma of skin, unspecified: Secondary | ICD-10-CM

## 2020-03-11 DIAGNOSIS — R972 Elevated prostate specific antigen [PSA]: Secondary | ICD-10-CM

## 2020-03-11 DIAGNOSIS — N529 Male erectile dysfunction, unspecified: Secondary | ICD-10-CM

## 2020-03-11 DIAGNOSIS — N400 Enlarged prostate without lower urinary tract symptoms: Secondary | ICD-10-CM

## 2020-03-11 DIAGNOSIS — E785 Hyperlipidemia, unspecified: Secondary | ICD-10-CM

## 2020-03-11 DIAGNOSIS — I719 Aortic aneurysm of unspecified site, without rupture: Secondary | ICD-10-CM

## 2020-03-11 DIAGNOSIS — I10 Essential (primary) hypertension: Secondary | ICD-10-CM

## 2020-03-11 LAB — PROSTATIC SPECIFIC ANTIGEN-PSA: Lab: 0.1 ng/mL (ref <6.01)

## 2020-03-11 NOTE — Progress Notes
Date of Service: 03/11/2020     Subjective:             Kenneth Macdonald is a 74 y.o. male.    Chief Complaint   Patient presents with   ? Follow Up   ? Prostate Cancer       Cancer Staging  Prostate cancer [facility])  Staging form: Prostate, AJCC 8th Edition  - Pathologic stage from 07/31/2016: Stage IIIB (pT3b, pN0, cM0, PSA: 4.5, Grade Group: 3) - Signed by Maryagnes Amos, PA-C on 02/19/2017      History of Present Illness  Kenneth Macdonald is a very pleasant 74 year old male, who underwent an anterior approach, robotic assisted laparoscopic prostatectomy, bilateral pelvic lymph node dissection, bilateral wide dissection, on 07/31/2016.  Pathology revealed [id number], Gleason score 4+3 equal 7, grade group 3 disease.  Most recent PSA was drawn locally on 03/12/2020, PSA was 0.13 ng/mL, patient did have a PSA drawn this morning before clinic visit, PSA was the same at 0.13 ng/mL.  This is a decrease from previous value.    He denies any urgency, states he urinates every 2 hours during the day.  He does have nocturia every 2 hours.  He does feel that he empties his bladder after urination.  He denies hematuria or recent infections.  He describes his stream is a good full stream.  He denies any hesitancy or intermittent break in his stream.  He will leak if he bends over or position change.  He does wear 5-6 pads a day, they are typically damp.  If he leaves them in a little bit longer they will be saturated.  He is able to wear 1 pad at bedtime, it is typically just damp because he is awakened to urinate.         Review of Systems   Constitutional: Negative for activity change, appetite change, chills, diaphoresis, fatigue, fever and unexpected weight change.   HENT: Negative for congestion, hearing loss, mouth sores, rhinorrhea, sinus pressure, sore throat and trouble swallowing.    Eyes: Negative for discharge and visual disturbance.   Respiratory: Negative for apnea, cough, chest tightness, shortness of breath and wheezing.    Cardiovascular: Negative for chest pain, palpitations and leg swelling.   Gastrointestinal: Negative for abdominal pain, blood in stool, constipation, diarrhea, nausea, rectal pain and vomiting.   Genitourinary: Negative for decreased urine volume, difficulty urinating, dysuria, enuresis, flank pain, frequency, genital sores, hematuria, penile discharge, penile pain, penile swelling, scrotal swelling, testicular pain and urgency.   Musculoskeletal: Negative for arthralgias, back pain, gait problem and myalgias.   Skin: Negative for rash and wound.   Neurological: Negative for dizziness, tremors, seizures, syncope, weakness, light-headedness, numbness and headaches.   Hematological: Negative for adenopathy. Does not bruise/bleed easily.   Psychiatric/Behavioral: Negative for agitation, behavioral problems, decreased concentration, dysphoric mood and sleep disturbance. The patient is not nervous/anxious.        Objective:         ? ACETAMINOPHEN (TYLENOL PO) Take 1,000 mg by mouth daily as needed.   ? aspirin EC 81 mg tablet Take 81 mg by mouth daily. Take with food.   ? cholecalciferol (VITAMIN D-3) 1,000 units tablet Take 5,000 Units by mouth daily.   ? FOLIC ACID/MULTIVIT-MIN/LUTEIN (CENTRUM SILVER PO) Take 1 tablet by mouth daily.   ? hyoscyamine sulfate (LEVSIN/SL) 0.125 mg sublingual tablet Place 1 tablet under tongue every 4 hours as needed for Cramps. Bladder spasms   ? lisinopril-hydrochlorothiazide (PRINZIDE, ZESTORETIC)  20-12.5 mg tablet Take 1 tablet by mouth every morning.   ? loratadine (CLARITIN) 10 mg tablet Take 10 mg by mouth every morning.   ? naproxen (NAPROSYN) 500 mg tablet Take 500 mg by mouth daily. Take with food.   ? nitrofurantoin monohyd/m-cryst (MACROBID) 100 mg capsule Take 1 capsule by mouth every 12 hours. Take with food. Begin taking the day prior to catheter removal.   ? omeprazole DR(+) (PRILOSEC) 20 mg capsule Take 20 mg by mouth daily before breakfast.   ? oxyCODONE/acetaminophen (PERCOCET; ENDOCET; ROXICET) 5/325 mg tablet Take 1 tablet by mouth every 4 hours as needed   ? polymyxin/bacitracin/neo/HC (CORTISPORIN) 1 % topical ointment Small amount to head of penis/catheter 3 times daily to reduce irritation.   ? senna/docusate (SENOKOT-S) 8.6/50 mg tablet Take 1 tablet by mouth twice daily.   ? simvastatin (ZOCOR) 40 mg tablet Take 80 mg by mouth at bedtime daily.   ? vitamin E 400 unit capsule Take 400 Units by mouth daily.       Vitals:    03/11/20 1310 03/11/20 1313   BP: 136/59    BP Source: Arm, Left Upper    Patient Position: Sitting    Pulse: 79    Resp: 18    Temp: 36.8 ?C (98.3 ?F)    TempSrc: Oral    SpO2: 95%    Weight: 132.5 kg (292 lb)    Height: 182.9 cm (72.01)    PainSc:  Zero       Body mass index is 39.59 kg/m?.     Labs:  [facility] PSA Hx:  Lab Results   Component Value Date    PSA 0.13 03/11/2020    PSA 0.14 11/18/2019    PSA 0.11 01/28/2019    PSA 0.09 11/06/2018    PSA 0.08 07/29/2018    PSA 0.08 05/06/2018    PSA 0.07 01/29/2018    PSA 0.11 09/17/2017    PSA 0.09 06/04/2017    PSA 0.08 02/19/2017    PSA 0.06 11/13/2016         Physical Exam  Vitals reviewed.   Constitutional:       General: He is not in acute distress.     Appearance: Normal appearance. He is not ill-appearing.   HENT:      Head: Normocephalic and atraumatic.   Eyes:      General: No scleral icterus.     Extraocular Movements: Extraocular movements intact.      Conjunctiva/sclera: Conjunctivae normal.   Pulmonary:      Effort: Pulmonary effort is normal. No respiratory distress.   Abdominal:      General: There is no distension.      Palpations: Abdomen is soft.   Musculoskeletal:         General: No swelling. Normal range of motion.      Cervical back: Normal range of motion.   Skin:     General: Skin is warm and dry.   Neurological:      Mental Status: He is alert and oriented to person, place, and time.   Psychiatric:         Mood and Affect: Mood normal.         Behavior: Behavior normal.         Thought Content: Thought content normal.         Judgment: Judgment normal.             Assessment and Plan:  Problem  Prostate Cancer (Hcc)    Fusion biopsy 06/10/16  Gleason 3+3=6 right prostate 1/6  Gleason 3+4=7 left prostate involving  4/6 cores    07/31/2016: RALP, BPLND, BWD Gleason 4+3=7 with tertiary 5, pT3aN0 (0/29)MxR1 (2mm pattern 4 and 5 at margin).    09/17/2019, PSA 0.17ng/ml, outside lab  03/02/2020, PSA 0.13ng.ml, outside lab    Labs:  [facility] PSA Hx:  Lab Results   Component Value Date    PSA 0.13 03/11/2020    PSA 0.14 11/18/2019    PSA 0.11 01/28/2019    PSA 0.09 11/06/2018    PSA 0.08 07/29/2018    PSA 0.08 05/06/2018    PSA 0.07 01/29/2018    PSA 0.11 09/17/2017    PSA 0.09 06/04/2017    PSA 0.08 02/19/2017    PSA 0.06 11/13/2016            Prostate cancer (HCC)  I had the pleasure of visiting with Mr. Serratore in clinic today.  He has good urinary control, no new urinary complaints.  He will have some occasional leaking of urine when he bends over or a position change.  He does continue to wear 5-6 pads a day, he typically changes them every couple hours and they are damp.  If he leaves a pad in longer, it will be saturated.  He feels that he empties his bladder after urination.  He describes his stream is a good full stream.  Most recent PSA was drawn today, PSA was 0.13 ng/mL.  He did have an outside PSA drawn on 03/12/2020, PSA was 0.13 ng/mL as well.  We will continue to monitor his PSA every 6 months, he is agreeable to this plan.    Plan:  1.  Return to clinic in 6 months with a PSA, he will see Dr. Jimmey Ralph  2.  Continue with Kegel exercises  3.  He will have his labs drawn a day or 2 prior to his visit in 6 months with Dr. Jimmey Ralph      Orders Placed This Encounter   ? PROSTATIC SPECIFIC ANTIGEN-PSA       San Jetty, APRN-NP  Urology

## 2020-06-11 IMAGING — CR XR chest 1V portable
1 series · 1 of 1 positions shown · non-contrast
Comparison: None

Procedure(s): XR chest 1V portable
PROCEDURE:

XR CHEST 1-V
HISTORY: Dyspnea
TECHNIQUE: One view of the chest is submitted.

[AP]
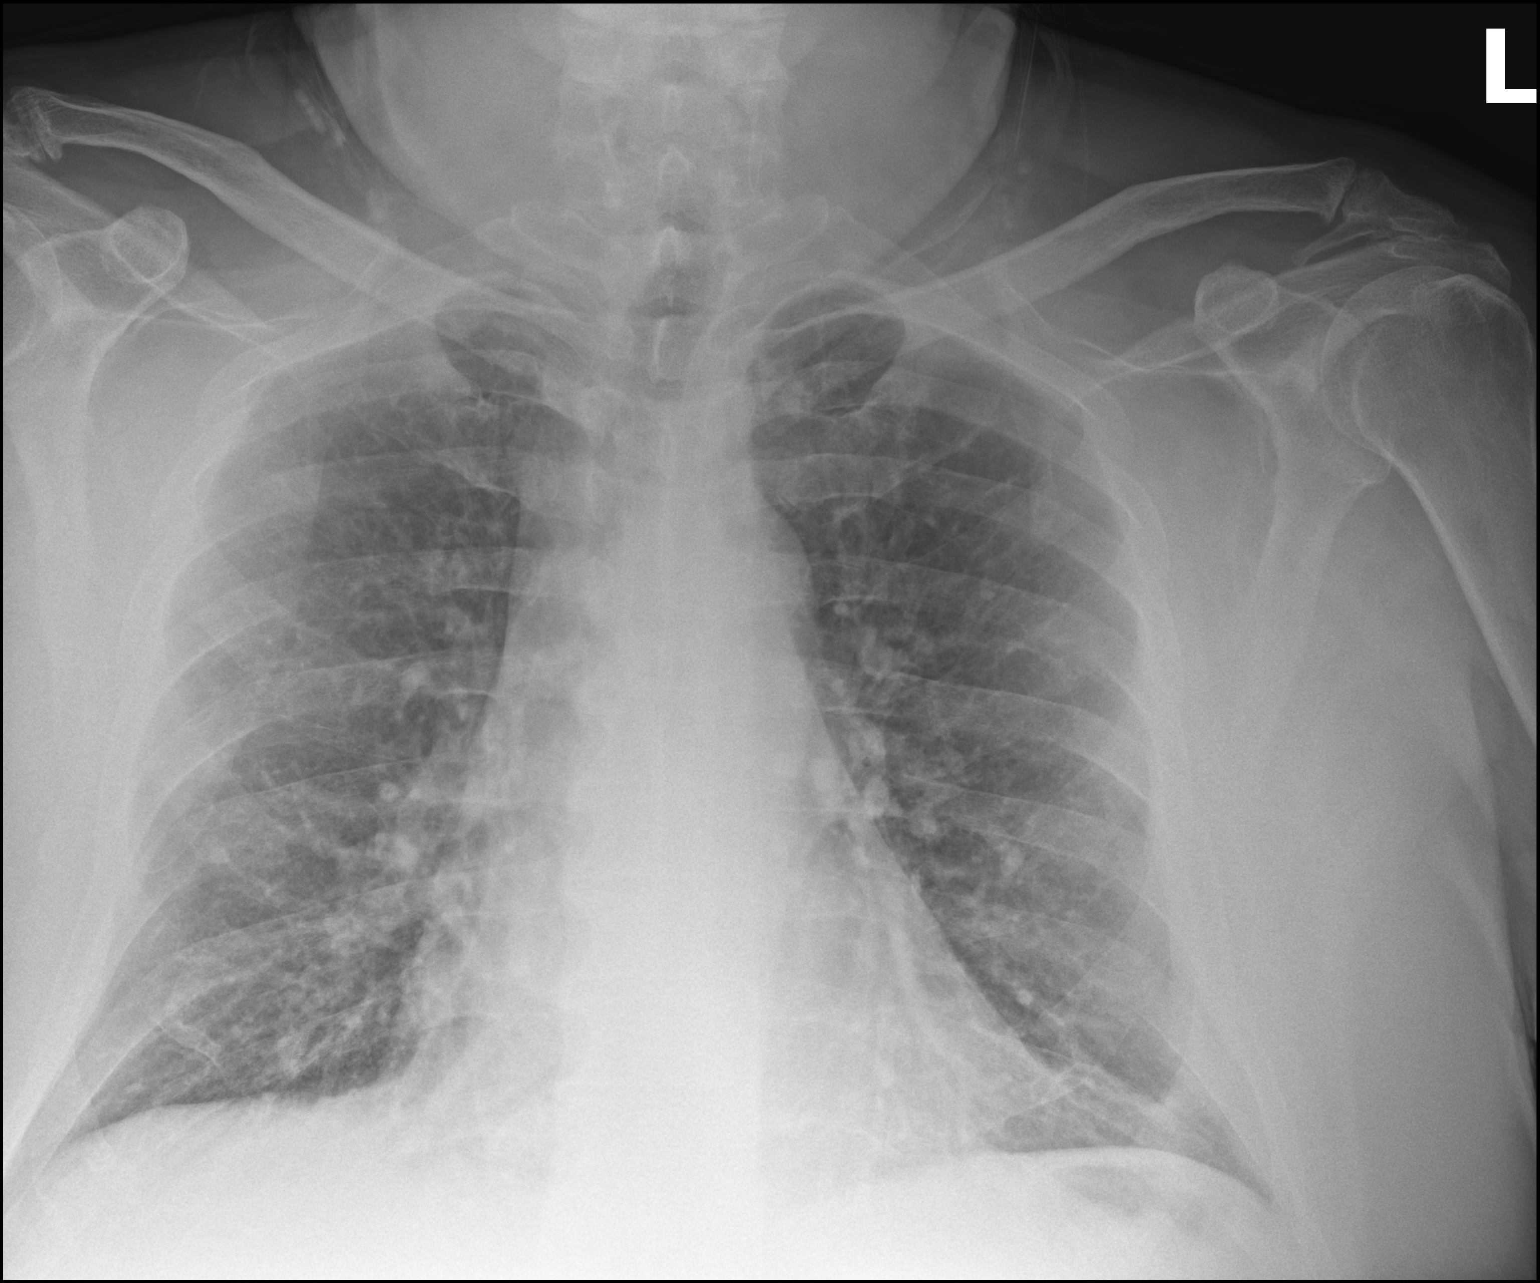

[1 of 1 positions shown; findings below may reference images not displayed]

FINDINGS: Heart: Normal

Lungs: There is bilateral perihilar, peribronchial thickening extending into the upper and lower
lungs and minimal scattered chronic lung changes throughout both lung fields.  The lungs are mildly
hyperinflated.

Mediastinum: Normal

Pleural effusions: None

Bones: There is no suspicious lytic or sclerotic lesion throughout the visualized bony structures.

Visualized upper abdomen: Normal
IMPRESSION: 1.  Findings suggestive of diffuse emphysematous/chronic lung changes.

2.  Bilateral perihilar, peribronchial thickening with no evidence of focal infiltrate compatible
with bronchitis.

## 2020-06-12 ENCOUNTER — Encounter: Admit: 2020-06-12 | Discharge: 2020-06-12 | Payer: MEDICARE

## 2020-06-12 NOTE — Telephone Encounter
Urology Phone Call    Patient developed acute onset gross hematuria this passed weekend. Denies difficulty emptying his bladder or any systemic signs of infection. Patient has previously completed pelvic radiation for prostate cancer in 2019 after undergoing a RALP and has never had an episode similar to this. Recommended patient drink plenty of water and advised he be seen in the ED if he developed systemic signs of infection, urinary retention, or progressive worsening of bleeding. Patient and his daughter understand and agree with plan.     Reita May, MD  Urology Resident

## 2020-06-13 ENCOUNTER — Encounter: Admit: 2020-06-13 | Discharge: 2020-06-13 | Payer: MEDICARE

## 2020-06-24 ENCOUNTER — Encounter: Admit: 2020-06-24 | Discharge: 2020-06-24 | Payer: MEDICARE

## 2020-06-24 DIAGNOSIS — R310 Gross hematuria: Secondary | ICD-10-CM

## 2020-07-15 ENCOUNTER — Encounter: Admit: 2020-07-15 | Discharge: 2020-07-15 | Payer: MEDICARE

## 2020-07-18 ENCOUNTER — Encounter: Admit: 2020-07-18 | Discharge: 2020-07-18 | Payer: MEDICARE

## 2020-07-18 NOTE — Telephone Encounter
Pt called and left message c/o blood in urine.   Returned call and spoke with patient. He reports yesterday having 1 episode of blood urine and again today, only once. Describes color as dark pink. No clots noted, he does feel like he is emptying his bladder completely. In bewtween these 2 episodes he reports urine has been clear yellow.   Denies fever, chills, flank pain. He does admit to not drinking enough water. Informed pt to continuing hydrating well to help flush bladder.   Return precautions reviewed for urinary retention or if blood worsens. He v/u and will call if there is any significant changes.

## 2020-07-19 ENCOUNTER — Encounter: Admit: 2020-07-19 | Discharge: 2020-07-19 | Payer: MEDICARE

## 2020-08-17 ENCOUNTER — Encounter: Admit: 2020-08-17 | Discharge: 2020-08-17 | Payer: MEDICARE

## 2020-08-17 ENCOUNTER — Ambulatory Visit: Admit: 2020-08-17 | Discharge: 2020-08-17 | Payer: MEDICARE

## 2020-08-17 DIAGNOSIS — C61 Malignant neoplasm of prostate: Secondary | ICD-10-CM

## 2020-08-17 LAB — PROSTATIC SPECIFIC ANTIGEN-PSA: Lab: 0.1 ng/mL (ref <6.01)

## 2020-08-18 ENCOUNTER — Encounter: Admit: 2020-08-18 | Discharge: 2020-08-18 | Payer: MEDICARE

## 2020-08-18 DIAGNOSIS — E785 Hyperlipidemia, unspecified: Secondary | ICD-10-CM

## 2020-08-18 DIAGNOSIS — R972 Elevated prostate specific antigen [PSA]: Secondary | ICD-10-CM

## 2020-08-18 DIAGNOSIS — C4491 Basal cell carcinoma of skin, unspecified: Secondary | ICD-10-CM

## 2020-08-18 DIAGNOSIS — I719 Aortic aneurysm of unspecified site, without rupture: Secondary | ICD-10-CM

## 2020-08-18 DIAGNOSIS — G4733 Obstructive sleep apnea (adult) (pediatric): Secondary | ICD-10-CM

## 2020-08-18 DIAGNOSIS — C61 Malignant neoplasm of prostate: Secondary | ICD-10-CM

## 2020-08-18 DIAGNOSIS — K219 Gastro-esophageal reflux disease without esophagitis: Secondary | ICD-10-CM

## 2020-08-18 DIAGNOSIS — N329 Bladder disorder, unspecified: Secondary | ICD-10-CM

## 2020-08-18 DIAGNOSIS — N400 Enlarged prostate without lower urinary tract symptoms: Secondary | ICD-10-CM

## 2020-08-18 DIAGNOSIS — R310 Gross hematuria: Secondary | ICD-10-CM

## 2020-08-18 DIAGNOSIS — Z20822 Encounter for screening laboratory testing for COVID-19 virus in asymptomatic patient: Secondary | ICD-10-CM

## 2020-08-18 DIAGNOSIS — N529 Male erectile dysfunction, unspecified: Secondary | ICD-10-CM

## 2020-08-18 DIAGNOSIS — I10 Essential (primary) hypertension: Secondary | ICD-10-CM

## 2020-08-18 MED ORDER — SODIUM CHLORIDE 0.9 % IJ SOLN
50 mL | Freq: Once | INTRAVENOUS | 0 refills | Status: CP
Start: 2020-08-18 — End: ?
  Administered 2020-08-18: 16:00:00 50 mL via INTRAVENOUS

## 2020-08-18 MED ORDER — LIDOCAINE HCL 2 % MM JELP
Freq: Once | TOPICAL | 0 refills | Status: AC
Start: 2020-08-18 — End: ?

## 2020-08-18 MED ORDER — WATER FOR INJECTION, STERILE IV SOLP
1000 mL | Freq: Once | 0 refills | Status: AC
Start: 2020-08-18 — End: ?

## 2020-08-18 MED ORDER — IOHEXOL 350 MG IODINE/ML IV SOLN
100 mL | Freq: Once | INTRAVENOUS | 0 refills | Status: CP
Start: 2020-08-18 — End: ?
  Administered 2020-08-18: 16:00:00 100 mL via INTRAVENOUS

## 2020-08-18 NOTE — Progress Notes
Date of Service: 08/18/2020    Subjective:             Kenneth Macdonald is a 75 y.o. male.    History of Present Illness  Kenneth Macdonald is a very pleasant 75 y.o. man who underwent a anterior prostatectomy with bilateral wide dissection and bilateral pelvic node dissection on 07/31/2016.  His pathology was notable for grade group 3, pT3aN0MxR1 disease.  He developed biochemical recurrence and completed radiation in 2019.  He has since developed 2 episodes of gross hematuria.           Review of Systems   Constitutional: Negative.    HENT: Positive for tinnitus.    Eyes: Negative.    Respiratory: Negative.    Cardiovascular: Negative.    Gastrointestinal: Negative.    Endocrine: Negative.    Genitourinary: Positive for enuresis.   Musculoskeletal: Positive for myalgias, neck pain and neck stiffness.   Skin: Negative.    Allergic/Immunologic: Positive for food allergies.   Neurological: Negative.    Hematological: Negative.    Psychiatric/Behavioral: Negative.          Objective:         ? ACETAMINOPHEN (TYLENOL PO) Take 1,000 mg by mouth daily as needed.   ? ascorbic acid (VITAMIN C PO) Take 1 tablet by mouth daily.   ? aspirin EC 81 mg tablet Take 81 mg by mouth daily. Take with food.   ? cholecalciferol (VITAMIN D-3) 1,000 units tablet Take 5,000 Units by mouth daily.   ? ergocalciferol (vitamin D2) (VITAMIN D PO) Take 1 tablet by mouth daily.   ? FOLIC ACID/MULTIVIT-MIN/LUTEIN (CENTRUM SILVER PO) Take 1 tablet by mouth daily.   ? hyoscyamine sulfate (LEVSIN/SL) 0.125 mg sublingual tablet Place 1 tablet under tongue every 4 hours as needed for Cramps. Bladder spasms   ? lisinopril-hydrochlorothiazide (PRINZIDE, ZESTORETIC) 20-12.5 mg tablet Take 1 tablet by mouth every morning.   ? loratadine (CLARITIN) 10 mg tablet Take 10 mg by mouth every morning.   ? naproxen (NAPROSYN) 500 mg tablet Take 500 mg by mouth daily. Take with food.   ? nitrofurantoin monohyd/m-cryst (MACROBID) 100 mg capsule Take 1 capsule by mouth every 12 hours. Take with food. Begin taking the day prior to catheter removal.   ? omeprazole DR(+) (PRILOSEC) 20 mg capsule Take 20 mg by mouth daily before breakfast.   ? oxyCODONE/acetaminophen (PERCOCET; ENDOCET; ROXICET) 5/325 mg tablet Take 1 tablet by mouth every 4 hours as needed   ? polymyxin/bacitracin/neo/HC (CORTISPORIN) 1 % topical ointment Small amount to head of penis/catheter 3 times daily to reduce irritation.   ? rosuvastatin (CRESTOR) 40 mg tablet Take 40 mg by mouth daily. Takes half a tablet a day   ? senna/docusate (SENOKOT-S) 8.6/50 mg tablet Take 1 tablet by mouth twice daily.   ? simvastatin (ZOCOR) 40 mg tablet Take 80 mg by mouth at bedtime daily.   ? vitamin E 400 unit capsule Take 400 Units by mouth daily.     Vitals:    08/18/20 1017   BP: 136/57   BP Source: Arm, Right Upper   Patient Position: Sitting   Pulse: 93   Resp: 18   Temp: 37.3 ?C (99.1 ?F)   TempSrc: Temporal   SpO2: 97%   Weight: 93.8 kg (206 lb 12.8 oz)   Height: 182.9 cm (6' 0.01)   PainSc: Zero     Body mass index is 28.04 kg/m?Marland Kitchen     Physical Exam  Vitals reviewed.   Constitutional:       Appearance: Normal appearance.   Neurological:      Mental Status: He is alert.         Lab Results   Component Value Date    PSA 0.19 08/17/2020    PSA 0.13 03/11/2020    PSA 0.14 11/18/2019    PSA 0.11 01/28/2019    PSA 0.09 11/06/2018          Assessment and Plan:  Hematuria  -  Cystoscopy reviewed  -  To the OR for fulguration and biopsy    Prostate cancer  -  PSA reviewed; will recheck in June with Dr. Flonnie Hailstone.  If continued rise will consider PSMA PET/CT    Myna Hidalgo, MD  Urologic Oncology  Department of Urology

## 2020-08-18 NOTE — Patient Instructions
Cystoscopy  Cystoscopy is a procedure that lets your healthcare provider look directly inside your urethra and bladder. It can be used to:  · Help diagnose a problem with your urethra, bladder, or kidneys.  · Take a sample (biopsy) of bladder or urethral tissue.  · Treat certain problems (such as removing kidney stones).  · Place a tube (stent) to bypass a blockage.  · Take special X-rays of the kidneys.  Based on the findings, your provider may advise other tests or treatments.  What is a cystoscope?  A cystoscope is a telescope-like tube that contains lenses and small glass wires that make bright light (fiberoptics). The cystoscope may be straight and rigid. Or it may be flexible to bend around curves in the urethra. The healthcare provider may look directly into the cystoscope, or project the image onto a screen.    Getting ready  · Ask your healthcare provider if you should stop taking any medicines before the procedure.  · Follow any directions you're given for not eating or drinking before the procedure.  · Follow any other instructions your healthcare provider gives you.    Tell your healthcare provider before the exam if you:  · Take any medicines, such as aspirin or blood thinners. This also includes any over-the-counter and prescription medicines, vitamins, herbs, and other supplements.  · Have allergies to any medicines  · Are pregnant or think you may be pregnant    During the procedure  Cystoscopy is done in the healthcare provider’s office, surgery center, or hospital. The doctor and a nurse are present during the procedure. It takes only a few minutes. It takes longer if a biopsy, X-ray, or treatment needs to be done.  During the procedure:  · You lie on an exam table on your back, knees bent and legs apart. You're covered with a drape.  · Your urethra and the area around it are washed. Anesthetic jelly may be applied to numb the urethra. Other pain medicine is often not needed. In some cases, you  may be offered a mild sedative to help you relax. If a more extensive procedure is to be done, such as a biopsy or kidney stone removal, general anesthesia may be needed. Often a one-time antibiotic is given.  · The cystoscope is inserted. A sterile fluid is put into the bladder to expand it. You may feel pressure from this fluid.  · When the procedure is done, the cystoscope is removed.  After the procedure  If you had a sedative, general anesthesia, or spinal anesthesia, you must have someone drive you home. Once you’re home:  · Drink plenty of fluids.  · You may have burning or light bleeding when you pee. This is normal.  · Medicines may be prescribed to ease any mild pain or prevent infection. Take these as directed.  When to call your healthcare provider  Call your healthcare provider if you have any of the following after the procedure:  · Heavy bleeding or blood clots  · Burning that lasts more than 1 day  · Fever of   100° F  ( 38°C ) or higher, or as directed by your provider  · Trouble peeing     [employer] last reviewed this educational content on 03/18/2020    © 2000-2021 [employer]. All rights reserved. This information is not intended as a substitute for professional medical care. Always follow your healthcare professional's instructions.

## 2020-08-18 NOTE — Procedures
Procedure:  Cystourethroscopy    Provider: Cindie Laroche, MD    Anesthesia:  2% Lidocaine Gel, Intraurethral  Complications:  None    Indication for Procedure:      75 y.o. male w/ a history of prostate cancer treated surgically with subsequent salvage radiation therapy.  He has now developed hematuria and presents for a cystoscopy.    After informed consent obtained, the patient was placed in dorsolithotomy position then prepped in the usual fashion.  Local 2% lidocaine gel placed per urethra.  The flexible cystoscope was gently passed into the urethra under direct visualization.  Urethra: No strictures or lesions.  Bladder Neck: Open.  Trabeculation: No.  Bilateral orthotopic UO w/ clear efflux of urine.  Retroflex View performed.  Small papillary lesion on the right trigone.  No stones or debris.  Subsequently the cystoscope was withdrawn.  The patient tolerated the procedure well, and left the exam room in a pleasant disposition.      Assessment/ Plan:    1.  Hematuria  - Cysto today revealed small papillary change related to radiation.  Will schedule for biopsy and fulguration      Orders Placed This Encounter    CYSTOSCOPY    POC CREATININE, RAD    sterile water for inj 1,000 mL    lidocaine hcl (XYLOCAINE) 2 % jelly (URO-JET)       Cindie Laroche, MD

## 2020-08-23 ENCOUNTER — Encounter: Admit: 2020-08-23 | Discharge: 2020-08-23 | Payer: MEDICARE

## 2020-08-23 ENCOUNTER — Ambulatory Visit: Admit: 2020-08-23 | Discharge: 2020-08-23 | Payer: MEDICARE

## 2020-08-23 DIAGNOSIS — N329 Bladder disorder, unspecified: Secondary | ICD-10-CM

## 2020-09-15 ENCOUNTER — Encounter: Admit: 2020-09-15 | Discharge: 2020-09-15 | Payer: MEDICARE

## 2020-09-20 ENCOUNTER — Encounter: Admit: 2020-09-20 | Discharge: 2020-09-20 | Payer: MEDICARE

## 2020-10-10 ENCOUNTER — Encounter: Admit: 2020-10-10 | Discharge: 2020-10-10 | Payer: MEDICARE

## 2020-10-10 ENCOUNTER — Ambulatory Visit: Admit: 2020-10-10 | Discharge: 2020-10-10 | Payer: MEDICARE

## 2020-10-10 DIAGNOSIS — R972 Elevated prostate specific antigen [PSA]: Secondary | ICD-10-CM

## 2020-10-10 DIAGNOSIS — N400 Enlarged prostate without lower urinary tract symptoms: Secondary | ICD-10-CM

## 2020-10-10 DIAGNOSIS — K219 Gastro-esophageal reflux disease without esophagitis: Secondary | ICD-10-CM

## 2020-10-10 DIAGNOSIS — N529 Male erectile dysfunction, unspecified: Secondary | ICD-10-CM

## 2020-10-10 DIAGNOSIS — E785 Hyperlipidemia, unspecified: Secondary | ICD-10-CM

## 2020-10-10 DIAGNOSIS — I719 Aortic aneurysm of unspecified site, without rupture: Secondary | ICD-10-CM

## 2020-10-10 DIAGNOSIS — G4733 Obstructive sleep apnea (adult) (pediatric): Secondary | ICD-10-CM

## 2020-10-10 DIAGNOSIS — C4491 Basal cell carcinoma of skin, unspecified: Secondary | ICD-10-CM

## 2020-10-10 DIAGNOSIS — I10 Essential (primary) hypertension: Secondary | ICD-10-CM

## 2020-10-10 MED ORDER — LIDOCAINE (PF) 200 MG/10 ML (2 %) IJ SYRG
INTRAVENOUS | 0 refills | Status: DC
Start: 2020-10-10 — End: 2020-10-10
  Administered 2020-10-10: 13:00:00 60 mg via INTRAVENOUS

## 2020-10-10 MED ORDER — FAMOTIDINE (PF) 20 MG/2 ML IV SOLN
INTRAVENOUS | 0 refills | Status: DC
Start: 2020-10-10 — End: 2020-10-10
  Administered 2020-10-10: 13:00:00 20 mg via INTRAVENOUS

## 2020-10-10 MED ORDER — ARTIFICIAL TEARS SINGLE DOSE DROPS GROUP
OPHTHALMIC | 0 refills | Status: DC
Start: 2020-10-10 — End: 2020-10-10
  Administered 2020-10-10: 13:00:00 2 [drp] via OPHTHALMIC

## 2020-10-10 MED ORDER — FENTANYL CITRATE (PF) 50 MCG/ML IJ SOLN
INTRAVENOUS | 0 refills | Status: DC
Start: 2020-10-10 — End: 2020-10-10
  Administered 2020-10-10 (×2): 50 ug via INTRAVENOUS

## 2020-10-10 MED ORDER — DEXAMETHASONE SODIUM PHOSPHATE 4 MG/ML IJ SOLN
INTRAVENOUS | 0 refills | Status: DC
Start: 2020-10-10 — End: 2020-10-10
  Administered 2020-10-10: 13:00:00 4 mg via INTRAVENOUS

## 2020-10-10 MED ORDER — CEFAZOLIN 1 GRAM IJ SOLR
INTRAVENOUS | 0 refills | Status: DC
Start: 2020-10-10 — End: 2020-10-10
  Administered 2020-10-10: 13:00:00 3 g via INTRAVENOUS

## 2020-10-10 MED ORDER — ONDANSETRON HCL (PF) 4 MG/2 ML IJ SOLN
INTRAVENOUS | 0 refills | Status: DC
Start: 2020-10-10 — End: 2020-10-10
  Administered 2020-10-10: 13:00:00 4 mg via INTRAVENOUS

## 2020-10-10 MED ORDER — PHENYLEPHRINE HCL IN 0.9% NACL 1 MG/10 ML (100 MCG/ML) IV SYRG
INTRAVENOUS | 0 refills | Status: DC
Start: 2020-10-10 — End: 2020-10-10
  Administered 2020-10-10 (×2): 100 ug via INTRAVENOUS

## 2020-10-10 MED ORDER — PROPOFOL INJ 10 MG/ML IV VIAL
INTRAVENOUS | 0 refills | Status: DC
Start: 2020-10-10 — End: 2020-10-10
  Administered 2020-10-10: 13:00:00 200 mg via INTRAVENOUS

## 2020-10-10 MED ADMIN — LIDOCAINE HCL 2 % MM JELP [170085]: 20 mL | TOPICAL | @ 13:00:00 | Stop: 2020-10-10 | NDC 76329301505

## 2020-10-10 MED ADMIN — LACTATED RINGERS IV SOLP [4318]: 1000.000 mL | INTRAVENOUS | @ 12:00:00 | Stop: 2020-10-10 | NDC 00338011704

## 2020-10-10 MED FILL — PHENAZOPYRIDINE 200 MG PO TAB: 200 mg | ORAL | 2 days supply | Qty: 6 | Fill #1 | Status: CP

## 2020-10-10 MED FILL — HYOSCYAMINE SULFATE 0.125 MG SL SUBL: 0.125 mg | SUBLINGUAL | 5 days supply | Qty: 30 | Fill #1 | Status: CP

## 2020-10-10 NOTE — Anesthesia Pre-Procedure Evaluation
Anesthesia Pre-Procedure Evaluation    Name: Kenneth Macdonald      MRN: [id number]     DOB: [birth year]     Age: 75 y.o.     Sex: male   _________________________________________________________________________     Procedure Info:   Procedure Information     Date/Time: 10/10/20 0855    Procedure: CYSTOURETHEROSCOPY WITH BIOPSY and Fulguration (N/A ) - **1 hr., Need rigid cystoscope with rigid biopsy forceps and Bugbee cautery.    Location: [facility] / [facility]    Surgeons: Myna Hidalgo, MD          Physical Assessment  Vital Signs (last filed in past 24 hours):  BP: 139/77 (04/25 0713)  Temp: 37 ?C (98.6 ?F) (04/25 4540)  Pulse: 76 (04/25 0713)  Respirations: 20 PER MINUTE (04/25 0713)  SpO2: 96 % (04/25 0713)  O2 Device: None (Room air) (04/25 0713)  Height: 185.4 cm (6' 1) (04/25 9811)  Weight: 136.1 kg (300 lb) (04/25 9147)      Patient History   Allergies   Allergen Reactions   ? Fish Containing Products SWOLLEN TONGUE   ? Ciprofloxacin HIVES and ITCHING   ? Levaquin [Levofloxacin] RASH and ITCHING        Current Medications    Medication Directions   ACETAMINOPHEN (TYLENOL PO) Take 1,000 mg by mouth daily as needed.   ascorbic acid (VITAMIN C PO) Take 1 tablet by mouth daily.   aspirin EC 81 mg tablet Take 81 mg by mouth daily. Take with food.   cholecalciferol (VITAMIN D-3) 1,000 units tablet Take 5,000 Units by mouth daily.   ergocalciferol (vitamin D2) (VITAMIN D PO) Take 1 tablet by mouth daily.   FOLIC ACID/MULTIVIT-MIN/LUTEIN (CENTRUM SILVER PO) Take 1 tablet by mouth daily.   lisinopril-hydrochlorothiazide (PRINZIDE, ZESTORETIC) 20-12.5 mg tablet Take 1 tablet by mouth every morning.   loratadine (CLARITIN) 10 mg tablet Take 10 mg by mouth every morning.   omeprazole DR(+) (PRILOSEC) 20 mg capsule Take 20 mg by mouth daily before breakfast.   rosuvastatin (CRESTOR) 40 mg tablet Take 40 mg by mouth daily. Takes half a tablet a day   simvastatin (ZOCOR) 40 mg tablet Take 80 mg by mouth at bedtime daily.   vitamin E 400 unit capsule Take 400 Units by mouth daily.         Review of Systems/Medical History      Patient summary reviewed  Nursing notes reviewed  Pertinent labs reviewed    PONV Screening: Non-smoker and Postoperative opioids  No history of anesthetic complications  No family history of anesthetic complications      Airway - negative        Pulmonary       Not a current smoker        No indications/hx of asthma    no COPD        Sleep apnea          Interventions: CPAP; compliant      Cardiovascular         Exercise tolerance: >4 METS      Beta Blocker therapy: No      Beta blockers within 24 hours: n/a        Hypertension, well controlled      No valvular problems/murmurs      No past MI,       No hx of coronary artery disease      No PTCA  No dysrhythmias      No angina      PVD (aortic aneurysm)      Hyperlipidemia      No dyspnea on exertion      GI/Hepatic/Renal           GERD, well controlled      No hx of liver disease     No renal disease      Neuro/Psych - negative      No seizures      No CVA      Musculoskeletal - negative        No neck pain      Endocrine/Other       No diabetes      No hypothyroidism      No anemia      Malignancy (h/o prostate cancer)      Obesity (bmi 39)    Constitution - negative   Physical Exam    Airway Findings      Mallampati: III      TM distance: >3 FB      Neck ROM: full      Mouth opening: good      Airway patency: adequate    Dental Findings: Negative      Cardiovascular Findings: Negative      Rhythm: regular      Rate: normal      No murmur    Pulmonary Findings: Negative      Breath sounds clear to auscultation.    Abdominal Findings:       Obese    Neurological Findings: Negative      Alert and oriented x 3    Constitutional findings: Negative      No acute distress      Well-developed      Well-nourished       Diagnostic Tests  Hematology:   Lab Results   Component Value Date    HGB 12.4 08/03/2016    HCT 38.6 08/03/2016    PLTCT 216 08/03/2016 WBC 10.5 08/03/2016    NEUT 86 08/03/2016    ANC 9.10 08/03/2016    ALC 0.90 08/03/2016    MONA 5 08/03/2016    AMC 0.50 08/03/2016    EOSA 0 08/03/2016    ABC 0.00 08/03/2016    MCV 92.9 08/03/2016    MCH 29.9 08/03/2016    MCHC 32.2 08/03/2016    MPV 7.5 08/03/2016    RDW 14.0 08/03/2016         General Chemistry:   Lab Results   Component Value Date    NA 137 08/03/2016    K 3.7 08/03/2016    CL 103 08/03/2016    CO2 25 08/03/2016    GAP 9 08/03/2016    BUN 27 08/03/2016    CR 0.9 08/18/2020    CR 1.02 08/03/2016    GLU 157 08/03/2016    CA 8.7 08/03/2016    ALBUMIN 4.3 08/03/2016    MG 2.3 08/03/2016    TOTBILI 0.9 08/03/2016      Coagulation: No results found for: PT, PTT, INR      Anesthesia Plan    ASA score: 3   Plan: general  Induction method: intravenous  NPO status: acceptable      Informed Consent  Anesthetic plan and risks discussed with patient.        Plan discussed with: anesthesiologist, CRNA and surgeon/proceduralist.  Comments: (I have discussed the risks/benefits/possible complications of general anesthesia  with the patient.  These have included dental/oral injury/tooth loss during airway manipulation, nausea/vomiting, delayed/prolonged emergence, and possible cardiac or pulmonary complications.  The patient expressed understanding and wishes to proceed with a general anesthetic.)

## 2020-10-10 NOTE — Anesthesia Post-Procedure Evaluation
Post-Anesthesia Evaluation    Name: Kenneth Macdonald      MRN: [id number]     DOB: Oct 24, 1945     Age: 75 y.o.     Sex: male   __________________________________________________________________________     Procedure Information     Anesthesia Start Date/Time: 10/10/20 0756    Procedure: CYSTOURETHEROSCOPY WITH BIOPSY and Fulguration (N/A Urethra) - **1 hr., Need rigid cystoscope with rigid biopsy forceps and Bugbee cautery.    Location: [facility] OR 2 / [place] MAIN OR/PERIOP    Surgeons: Cindie Laroche, MD          Post-Anesthesia Vitals  BP: 141/81 (04/25 0925)  Temp: 36.3 C (97.4 F) (04/25 0925)  Pulse: 70 (04/25 0925)  Respirations: 20 PER MINUTE (04/25 0925)  SpO2: 97 % (04/25 0925)  SpO2 Pulse: 71 (04/25 0925)  O2 Device: None (Room air) (04/25 0925)  Height: 185.4 cm (6\' 1" ) (04/25 0658)   Vitals Value Taken Time   BP 141/81 10/10/20 0925   Temp 36.3 C (97.4 F) 10/10/20 0925   Pulse 70 10/10/20 0925   Respirations 20 PER MINUTE 10/10/20 0925   SpO2 97 % 10/10/20 0925   O2 Device None (Room air) 10/10/20 0925   ABP     ART BP           Post Anesthesia Evaluation Note    Evaluation location: pre/post  Patient participation: recovered; patient participated in evaluation  Level of consciousness: alert    Pain score: 0  Pain management: adequate    Hydration: normovolemia  Temperature: 36.0C - 38.4C  Airway patency: adequate    Perioperative Events       Post-op nausea and vomiting: no PONV    Postoperative Status  Cardiovascular status: hemodynamically stable  Respiratory status: spontaneous ventilation        Perioperative Events  There were no known complications for this encounter.

## 2020-10-12 ENCOUNTER — Encounter: Admit: 2020-10-12 | Discharge: 2020-10-12 | Payer: MEDICARE

## 2020-10-12 DIAGNOSIS — I719 Aortic aneurysm of unspecified site, without rupture: Secondary | ICD-10-CM

## 2020-10-12 DIAGNOSIS — C4491 Basal cell carcinoma of skin, unspecified: Secondary | ICD-10-CM

## 2020-10-12 DIAGNOSIS — I10 Essential (primary) hypertension: Secondary | ICD-10-CM

## 2020-10-12 DIAGNOSIS — N529 Male erectile dysfunction, unspecified: Secondary | ICD-10-CM

## 2020-10-12 DIAGNOSIS — G4733 Obstructive sleep apnea (adult) (pediatric): Secondary | ICD-10-CM

## 2020-10-12 DIAGNOSIS — R972 Elevated prostate specific antigen [PSA]: Secondary | ICD-10-CM

## 2020-10-12 DIAGNOSIS — K219 Gastro-esophageal reflux disease without esophagitis: Secondary | ICD-10-CM

## 2020-10-12 DIAGNOSIS — E785 Hyperlipidemia, unspecified: Secondary | ICD-10-CM

## 2020-10-12 DIAGNOSIS — N400 Enlarged prostate without lower urinary tract symptoms: Secondary | ICD-10-CM

## 2020-10-17 ENCOUNTER — Encounter: Admit: 2020-10-17 | Discharge: 2020-10-17 | Payer: MEDICARE

## 2020-10-17 DIAGNOSIS — C61 Malignant neoplasm of prostate: Secondary | ICD-10-CM

## 2020-11-10 ENCOUNTER — Encounter: Admit: 2020-11-10 | Discharge: 2020-11-10 | Payer: MEDICARE

## 2020-11-10 ENCOUNTER — Ambulatory Visit: Admit: 2020-11-10 | Discharge: 2020-11-10 | Payer: MEDICARE

## 2020-11-10 DIAGNOSIS — C61 Malignant neoplasm of prostate: Secondary | ICD-10-CM

## 2020-11-10 LAB — PROSTATIC SPECIFIC ANTIGEN-PSA: PROSTATIC SPEC AG: 0.1 ng/mL (ref <6.01)

## 2020-11-11 ENCOUNTER — Ambulatory Visit: Admit: 2020-11-11 | Discharge: 2020-11-11 | Payer: MEDICARE

## 2020-11-11 ENCOUNTER — Encounter: Admit: 2020-11-11 | Discharge: 2020-11-11 | Payer: MEDICARE

## 2020-11-11 DIAGNOSIS — C4491 Basal cell carcinoma of skin, unspecified: Secondary | ICD-10-CM

## 2020-11-11 DIAGNOSIS — N400 Enlarged prostate without lower urinary tract symptoms: Secondary | ICD-10-CM

## 2020-11-11 DIAGNOSIS — R972 Elevated prostate specific antigen [PSA]: Secondary | ICD-10-CM

## 2020-11-11 DIAGNOSIS — I719 Aortic aneurysm of unspecified site, without rupture: Secondary | ICD-10-CM

## 2020-11-11 DIAGNOSIS — K219 Gastro-esophageal reflux disease without esophagitis: Secondary | ICD-10-CM

## 2020-11-11 DIAGNOSIS — N529 Male erectile dysfunction, unspecified: Secondary | ICD-10-CM

## 2020-11-11 DIAGNOSIS — I10 Essential (primary) hypertension: Secondary | ICD-10-CM

## 2020-11-11 DIAGNOSIS — E785 Hyperlipidemia, unspecified: Secondary | ICD-10-CM

## 2020-11-11 DIAGNOSIS — G4733 Obstructive sleep apnea (adult) (pediatric): Secondary | ICD-10-CM

## 2021-04-19 ENCOUNTER — Encounter: Admit: 2021-04-19 | Discharge: 2021-04-19 | Payer: MEDICARE

## 2021-04-19 ENCOUNTER — Ambulatory Visit: Admit: 2021-04-19 | Discharge: 2021-04-19 | Payer: MEDICARE

## 2021-04-19 DIAGNOSIS — C61 Malignant neoplasm of prostate: Secondary | ICD-10-CM

## 2021-04-19 LAB — PROSTATIC SPECIFIC ANTIGEN-PSA: PROSTATIC SPEC AG: 0.2 ng/mL (ref <6.01)

## 2021-04-19 NOTE — Progress Notes
Name: Kenneth Macdonald       MRN: [id number] DOB: Nov 27, 1945      AGE: 75 y.o.    Prostate Cancer Follow-up Visit    Date of Service: 04/20/2021     Subjective:               Kenneth Macdonald is a very pleasant 75 y.o. man who underwent a anterior prostatectomy with bilateral wide dissection and bilateral pelvic node dissection on 07/31/2016.  His pathology was notable for grade group 3, pT3aN0MxR1 disease.  Following surgery he underwent salvage radiation to the prostate fossa (completed 12/05/2017) for a rising PSA.  Following radiation he had a decline in the PSA with a slow rise subsequently.  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  He has had no further hematuria.        He is not continent and uses 5-6 pads per day. He is comfortable with this degree of incontinence.      Since his last visit he denies any changes to his health history         Review of Systems   All other systems reviewed and are negative.      Objective:         ? ACETAMINOPHEN (TYLENOL PO) Take 1,000 mg by mouth daily as needed.   ? ascorbic acid (VITAMIN C PO) Take 1 tablet by mouth daily.   ? aspirin EC 81 mg tablet Take 81 mg by mouth daily. Take with food.   ? cholecalciferol (VITAMIN D-3) 1,000 units tablet Take 5,000 Units by mouth daily.   ? ergocalciferol (vitamin D2) (VITAMIN D PO) Take 1 tablet by mouth daily.   ? FOLIC ACID/MULTIVIT-MIN/LUTEIN (CENTRUM SILVER PO) Take 1 tablet by mouth daily.   ? hyoscyamine sulfate (LEVSIN/SL) 0.125 mg sublingual tablet Place one tablet under tongue every 4 hours as needed (Bladder Spasms, urinary urgency/frequency (mild)).   ? lisinopril-hydrochlorothiazide (PRINZIDE, ZESTORETIC) 20-12.5 mg tablet Take 1 tablet by mouth every morning.   ? loratadine (CLARITIN) 10 mg tablet Take 10 mg by mouth every morning.   ? omeprazole DR(+) (PRILOSEC) 20 mg capsule Take 20 mg by mouth daily before breakfast.   ? rosuvastatin (CRESTOR) 40 mg tablet Take 40 mg by mouth daily. Takes half a tablet a day   ? simvastatin (ZOCOR) 40 mg tablet Take 80 mg by mouth at bedtime daily.   ? vitamin E 400 unit capsule Take 400 Units by mouth daily.     There were no vitals filed for this visit.  There is no height or weight on file to calculate BMI.          ECOG performance status is 0, Fully active, able to carry on all pre-disease performance without restriction.    Physical Examination:  Well developed, well appearing man in no acute distress.  He appears his stated age.    Breathing is non-labored, chest rise is equal bilaterally.  Neurologic exam is non-focal.  Mood and affect are normal.    Lab Results   Component Value Date    PSA 0.21 04/19/2021    PSA 0.18 11/10/2020    PSA 0.19 08/17/2020    PSA 0.13 03/11/2020    PSA 0.14 11/18/2019            Assessment and Plan:          ~~~~~~~~~~~~~    Diagnosis: Prostate  cancer      Cancer Staging  Prostate cancer [facility])  Staging form: Prostate, AJCC 8th Edition  - Pathologic stage from 07/31/2016: Stage IIIB (pT3b, pN0, cM0, PSA: 4.5, Grade Group: 3) - Signed by Maryagnes Amos, PA-C on 02/19/2017      PSA noted from 04/19/2021; his PSA does continue to rise but is below our threshold of 0.5.  We will continue to monitor with every 6 month PSA testing    Plan:  Return to clinic in 6 months with a repeat PSA.  If his PSA approaches 0.5 or if the doubling time reduces to <12 months then we will proceed with a PSMA PET/CT.    Myna Hidalgo, MD  Urologic Oncology  Department of Urology      ~~~~~~~~~~~~~

## 2021-04-20 ENCOUNTER — Encounter: Admit: 2021-04-20 | Discharge: 2021-04-20 | Payer: MEDICARE

## 2021-04-20 DIAGNOSIS — C61 Malignant neoplasm of prostate: Secondary | ICD-10-CM

## 2021-04-20 DIAGNOSIS — N529 Male erectile dysfunction, unspecified: Secondary | ICD-10-CM

## 2021-04-20 DIAGNOSIS — E785 Hyperlipidemia, unspecified: Secondary | ICD-10-CM

## 2021-04-20 DIAGNOSIS — I719 Aortic aneurysm of unspecified site, without rupture: Secondary | ICD-10-CM

## 2021-04-20 DIAGNOSIS — R972 Elevated prostate specific antigen [PSA]: Secondary | ICD-10-CM

## 2021-04-20 DIAGNOSIS — G4733 Obstructive sleep apnea (adult) (pediatric): Secondary | ICD-10-CM

## 2021-04-20 DIAGNOSIS — N400 Enlarged prostate without lower urinary tract symptoms: Secondary | ICD-10-CM

## 2021-04-20 DIAGNOSIS — C4491 Basal cell carcinoma of skin, unspecified: Secondary | ICD-10-CM

## 2021-04-20 DIAGNOSIS — I10 Essential (primary) hypertension: Secondary | ICD-10-CM

## 2021-04-20 DIAGNOSIS — K219 Gastro-esophageal reflux disease without esophagitis: Secondary | ICD-10-CM

## 2021-06-30 ENCOUNTER — Encounter: Admit: 2021-06-30 | Discharge: 2021-06-30 | Payer: MEDICARE

## 2021-06-30 DIAGNOSIS — C61 Malignant neoplasm of prostate: Secondary | ICD-10-CM

## 2021-07-04 NOTE — Progress Notes
Radiation Oncology Follow-Up Note    Date: 07/07/2021       Camile Ruddle is a 76 y.o. male.     There were no encounter diagnoses.  Staging: Cancer Staging  Prostate cancer [facility])  Staging form: Prostate, AJCC 8th Edition  - Pathologic stage from 07/31/2016: Stage IIIB (pT3b, pN0, cM0, PSA: 4.5, Grade Group: 3) - Signed by Maryagnes Amos, PA-C on 02/19/2017        Subjective:          Cancer Staging  Prostate cancer [facility])  Staging form: Prostate, AJCC 8th Edition  - Pathologic stage from 07/31/2016: Stage IIIB (pT3b, pN0, cM0, PSA: 4.5, Grade Group: 3) - Signed by Maryagnes Amos, PA-C on 02/19/2017      Site Treatment Dates [employer] Dose/?Fraction (cGy) Total Dose (cGy) Fractions Missed Treatment Days   Prostate bed 10/16/17 - 12/05/17 IMRT 6X [id number] 2   Concurrent systemic therapy:??None  ?        History of Present Illness  Mr. 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 PSA?subsequently?increased to 0.11.???He was treated with a course of salvage radiation therapy to the prostate bed completed 12/05/2017.  His PSA since completion of salvage radiation has been increasing, now up to 0.14.    Mr. Mehner presents today for his interval f/u post radiation therapy for prostate cancer    Mr. Weidel has Nocturia every 2 hours, frequency is at baseline, he does have some leakage but this is managable, he reports a moderate flow. IPSS=6. Reports his urination is unchanged since his last visit.     Overall Mr. Subler reports he is doing well with no changes in health. Reports his bowels are at baseline- denies blood in stool.     He is accompanied in clinic today with his wife and offers up no concerns at this time.        Review of Systems   Gastrointestinal: Negative for blood in stool and diarrhea.   Genitourinary: Positive for frequency. Negative for difficulty urinating, dysuria and hematuria.   All other systems reviewed and are negative.        Objective:         ? ACETAMINOPHEN (TYLENOL PO) Take 1,000 mg by mouth daily as needed.   ? ascorbic acid (VITAMIN C PO) Take 1 tablet by mouth daily.   ? aspirin EC 81 mg tablet Take 81 mg by mouth daily. Take with food.   ? cholecalciferol (VITAMIN D-3) 1,000 units tablet Take 5,000 Units by mouth daily.   ? ergocalciferol (vitamin D2) (VITAMIN D PO) Take 1 tablet by mouth daily.   ? FOLIC ACID/MULTIVIT-MIN/LUTEIN (CENTRUM SILVER PO) Take 1 tablet by mouth daily.   ? hyoscyamine sulfate (LEVSIN/SL) 0.125 mg sublingual tablet Place one tablet under tongue every 4 hours as needed (Bladder Spasms, urinary urgency/frequency (mild)).   ? lisinopril-hydrochlorothiazide (PRINZIDE, ZESTORETIC) 20-12.5 mg tablet Take 1 tablet by mouth every morning.   ? loratadine (CLARITIN) 10 mg tablet Take 10 mg by mouth every morning.   ? omeprazole DR(+) (PRILOSEC) 20 mg capsule Take 20 mg by mouth daily before breakfast.   ? rosuvastatin (CRESTOR) 40 mg tablet Take 40 mg by mouth daily. Takes half a tablet a day   ? simvastatin (ZOCOR) 40 mg tablet Take 80 mg by mouth at bedtime daily.   ? vitamin E 400 unit capsule Take 400 Units by mouth  daily.     Vitals:    07/07/21 1251 07/07/21 1252   BP: (!) 148/77    Pulse: 82    SpO2: 96%    PainSc:  Zero   Weight: (!) 138.2 kg (304 lb 9.6 oz)    Height: 186.7 cm (6' 1.5)      Body mass index is 39.64 kg/m?Marland Kitchen                      Physical Exam      General:  A&Ox3, in no acute distress.  Head:  Normocephalic and atraumatic.  Eyes: Normal sclerae / conjunctivae.  Neck: trachea midline  Lungs: Breathing nonlabored, no wheezing  Abdomen: nondistended  Skin: no rashes or pallor  Psychiatric: Normal mood and affect        Lab Results   Component Value Date/Time    PSA 0.21 07/06/2021 02:55 PM    PSA 0.21 04/19/2021 01:32 PM    PSA 0.18 11/10/2020 02:46 PM    PSA 0.19 08/17/2020 10:17 AM    PSA 0.13 03/11/2020 10:38 AM    PSA 0.14 11/18/2019 08:55 AM    PSA 0.11 01/28/2019 10:38 AM    PSA 0.09 11/06/2018 10:13 AM    PSA 0.08 07/29/2018 10:57 AM    PSA 0.08 05/06/2018 10:59 AM    PSA 0.07 01/29/2018 10:13 AM    PSA 0.11 09/17/2017 09:03 AM    PSA 0.09 06/04/2017 08:40 AM    PSA 0.08 02/19/2017 08:12 AM    PSA 0.06 11/13/2016 08:26 AM              Assessment and Plan:  Mr. 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 PSA?subsequently?increased to 0.11.???He was treated with a course of salvage radiation therapy to the prostate bed completed 12/05/2017.  His PSA since completion of salvage radiation has been increasing, now up to 0.14.    1. Prostate Cancer:  - PSA is at 0.21 This is stable. We will wait until the PSA hits 0.5 to obtain imaging or if his PSA doubling time is <12 months.  - Mr. Vink would like to f/u with Dr. Jimmey Ralph in 6 months and then with radiation oncology as he already has several doctors appointments. . I think this is reasonable. Discussed that we will see him earlier if he needs a PSMA scan before that  - Will continue to follow his PSA.   - Follow up in 1 year with PSA  - Discussed potential treatment options to include surveillance, additional radiation therapy or hormonal therapy. Discussed that after his PSMA scan he will discuss these options further with Dr. Flonnie Hailstone.         Melodye Ped APRN, FNP-C  Nurse Practitioner  Department of Radiation Oncology [facility]    Total time including obtaining and review of records, patient face to face time, order entry, placing referrals, and documentation: 30 minutes.

## 2021-07-06 ENCOUNTER — Encounter: Admit: 2021-07-06 | Discharge: 2021-07-06 | Payer: MEDICARE

## 2021-07-06 ENCOUNTER — Ambulatory Visit: Admit: 2021-07-06 | Discharge: 2021-07-06 | Payer: MEDICARE

## 2021-07-06 DIAGNOSIS — C61 Malignant neoplasm of prostate: Secondary | ICD-10-CM

## 2021-07-06 LAB — PROSTATIC SPECIFIC ANTIGEN-PSA: PROSTATIC SPEC AG: 0.2 ng/mL (ref <6.01)

## 2021-07-07 ENCOUNTER — Encounter: Admit: 2021-07-07 | Discharge: 2021-07-07 | Payer: MEDICARE

## 2021-07-07 ENCOUNTER — Ambulatory Visit: Admit: 2021-07-07 | Discharge: 2021-07-07 | Payer: MEDICARE

## 2021-07-07 DIAGNOSIS — C61 Malignant neoplasm of prostate: Secondary | ICD-10-CM

## 2021-11-06 ENCOUNTER — Encounter: Admit: 2021-11-06 | Discharge: 2021-11-06 | Payer: MEDICARE

## 2021-11-08 ENCOUNTER — Encounter: Admit: 2021-11-08 | Discharge: 2021-11-08 | Payer: MEDICARE

## 2021-11-08 ENCOUNTER — Ambulatory Visit: Admit: 2021-11-08 | Discharge: 2021-11-08 | Payer: MEDICARE

## 2021-11-08 DIAGNOSIS — C61 Malignant neoplasm of prostate: Secondary | ICD-10-CM

## 2021-11-08 LAB — PROSTATIC SPECIFIC ANTIGEN-PSA: PROSTATIC SPEC AG: 0.2 ng/mL (ref <6.01)

## 2021-11-09 ENCOUNTER — Encounter: Admit: 2021-11-09 | Discharge: 2021-11-09 | Payer: MEDICARE

## 2021-11-09 DIAGNOSIS — N529 Male erectile dysfunction, unspecified: Secondary | ICD-10-CM

## 2021-11-09 DIAGNOSIS — C4491 Basal cell carcinoma of skin, unspecified: Secondary | ICD-10-CM

## 2021-11-09 DIAGNOSIS — I719 Aortic aneurysm of unspecified site, without rupture: Secondary | ICD-10-CM

## 2021-11-09 DIAGNOSIS — C61 Malignant neoplasm of prostate: Secondary | ICD-10-CM

## 2021-11-09 DIAGNOSIS — I10 Essential (primary) hypertension: Secondary | ICD-10-CM

## 2021-11-09 DIAGNOSIS — N400 Enlarged prostate without lower urinary tract symptoms: Secondary | ICD-10-CM

## 2021-11-09 DIAGNOSIS — R972 Elevated prostate specific antigen [PSA]: Secondary | ICD-10-CM

## 2021-11-09 DIAGNOSIS — K219 Gastro-esophageal reflux disease without esophagitis: Secondary | ICD-10-CM

## 2021-11-09 DIAGNOSIS — E785 Hyperlipidemia, unspecified: Secondary | ICD-10-CM

## 2021-11-09 DIAGNOSIS — G4733 Obstructive sleep apnea (adult) (pediatric): Secondary | ICD-10-CM

## 2021-11-09 NOTE — Progress Notes
Name: Kenneth Macdonald          MRN: [id number]      DOB: Mar 06, 1946      AGE: 76 y.o.   DATE OF SERVICE: 11/09/2021    Subjective:             Reason for Visit:  Follow Up and Prostate Cancer      Kenneth Macdonald is a 76 y.o. male.      Cancer Staging   Prostate cancer [facility])  Staging form: Prostate, AJCC 8th Edition  - Pathologic stage from 07/31/2016: Stage IIIB (pT3b, pN0, cM0, PSA: 4.5, Grade Group: 3) - Signed by Maryagnes Amos, PA-C on 02/19/2017      History of Present Illness  Mr. Kenneth Macdonald is a pleasant 76 year old male, who underwent an anterior approach, robotic assisted laparoscopic prostatectomy, bilateral wide dissection, bilateral pelvic lymph node dissection on 07/31/2016.  Pathology revealed pT3AN0MXR1, grade group 3 disease.  Following surgery, he did receive salvage radiation, radiation occurred on 10/16/2017 through 12/05/2017, he did not receive any ADT therapy.  He received radiation to  the prostate fossa, this was completed on 12/05/2017 for rising PSA.  Following radiation, he had a decline in the PSA with the slow rise subsequently.  Most recent PSA was performed on 11/08/2021, PSA was 0.21 ng/L, this is a stable value same as previous value.    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 He had a CT scan performed on 08/18/2020 for his hematuria, it was negative.     He has had urinate.  He stream.  He has nocturia every 2 hours.  He has empties his bladder he did have a small incidence of hematuria about 6 weeks ago.  He denies any recent infections.  He describes his stream is a good full stream.  He denies any hesitancy or intermittent break in his stream.  He denies any leaking of urine.  He does wear 5-6 pads a day, they are typically damp.    He did have a cortisone shot in his right knee, he also underwent a right carpal tunnel surgery in March 2023.  He will probably have his carpal tunnel in his left hand performed this fall.         Review of Systems   Constitutional: Negative.  Negative for activity change, appetite change, chills, diaphoresis, fatigue, fever and unexpected weight change.   HENT: Negative.  Negative for congestion, hearing loss, mouth sores, rhinorrhea, sinus pressure, sore throat and trouble swallowing.    Eyes: Negative.  Negative for discharge and visual disturbance.   Respiratory: Negative.  Negative for apnea, cough, chest tightness, shortness of breath and wheezing.    Cardiovascular: Negative.  Negative for chest pain, palpitations and leg swelling.   Gastrointestinal: Negative.  Negative for abdominal pain, blood in stool, constipation, diarrhea, nausea, rectal pain and vomiting.   Endocrine: Negative.    Genitourinary: Positive for enuresis and hematuria. Negative for decreased urine volume, difficulty urinating, dysuria, flank pain, frequency, genital sores, penile discharge, penile pain, penile swelling, scrotal swelling, testicular pain and urgency.   Musculoskeletal: Negative.  Negative for arthralgias, back pain, gait problem and myalgias.   Skin: Negative.  Negative for rash and wound.   Allergic/Immunologic: Negative.    Neurological: Negative.  Negative for dizziness, tremors, seizures, syncope, weakness, light-headedness, numbness and headaches.   Hematological: Negative.  Negative for adenopathy. Does not bruise/bleed easily.  Psychiatric/Behavioral: Negative.  Negative for agitation, behavioral problems, decreased concentration, dysphoric mood and sleep disturbance. The patient is not nervous/anxious.    All other systems reviewed and are negative.        Objective:         ? ACETAMINOPHEN (TYLENOL PO) Take 1,000 mg by mouth daily as needed.   ? ascorbic acid (VITAMIN C PO) Take 1 tablet by mouth daily.   ? aspirin EC 81 mg tablet Take one tablet by mouth daily. Take with food.   ? cholecalciferol (VITAMIN D-3) 1,000 units tablet Take five tablets by mouth daily.   ? ergocalciferol (vitamin D2) (VITAMIN D PO) Take 1 tablet by mouth daily.   ? FOLIC ACID/MULTIVIT-MIN/LUTEIN (CENTRUM SILVER PO) Take 1 tablet by mouth daily.   ? hyoscyamine sulfate (LEVSIN/SL) 0.125 mg sublingual tablet Place one tablet under tongue every 4 hours as needed (Bladder Spasms, urinary urgency/frequency (mild)).   ? lisinopril-hydrochlorothiazide (PRINZIDE, ZESTORETIC) 20-12.5 mg tablet Take one tablet by mouth every morning.   ? loratadine (CLARITIN) 10 mg tablet Take one tablet by mouth every morning.   ? omeprazole DR(+) (PRILOSEC) 20 mg capsule Take one capsule by mouth daily before breakfast.   ? rosuvastatin (CRESTOR) 40 mg tablet Take one tablet by mouth daily. Takes half a tablet a day   ? simvastatin (ZOCOR) 40 mg tablet Take two tablets by mouth at bedtime daily.   ? vitamin E 400 unit capsule Take one capsule by mouth daily.     Vitals:    11/09/21 0923   BP: (!) 143/70   BP Source: Arm, Left Upper   Pulse: 65   Temp: 36.8 ?C (98.2 ?F)   Resp: 22   SpO2: 97%   O2 Device: None (Room air)   TempSrc: Temporal   PainSc: Zero   Weight: 135 kg (297 lb 9.6 oz)   Height: 185.4 cm (6' 1)     Body mass index is 39.26 kg/m?Marland Kitchen     Pain Score: Zero       Fatigue Scale: 0-None    Pain Addressed:  N/A    Patient Evaluated for a Clinical Trial: No treatment clinical trial available for this patient.     [place] Cooperative Oncology Group performance status is 0, Fully active, able to carry on all pre-disease performance without restriction.Marland Kitchen     Physical Exam  Vitals reviewed.   Constitutional:       General: He is not in acute distress.     Appearance: Normal appearance. He is not ill-appearing.   HENT:      Head: Normocephalic and atraumatic.   Eyes:      General: No scleral icterus.     Extraocular Movements: Extraocular movements intact.      Conjunctiva/sclera: Conjunctivae normal.   Pulmonary:      Effort: Pulmonary effort is normal. No respiratory distress.   Abdominal:      General: There is no distension.      Palpations: Abdomen is soft.   Musculoskeletal:         General: No swelling. Normal range of motion.      Cervical back: Normal range of motion.   Skin:     General: Skin is warm and dry.   Neurological:      Mental Status: He is alert and oriented to person, place, and time.   Psychiatric:         Mood and Affect: Mood normal.  Behavior: Behavior normal.         Thought Content: Thought content normal.         Judgment: Judgment normal.       Labs:  [place] PSA Hx:  Lab Results   Component Value Date    PSA 0.21 11/08/2021    PSA 0.21 07/06/2021    PSA 0.21 04/19/2021    PSA 0.18 11/10/2020    PSA 0.19 08/17/2020    PSA 0.13 03/11/2020    PSA 0.14 11/18/2019    PSA 0.11 01/28/2019    PSA 0.09 11/06/2018    PSA 0.08 07/29/2018    PSA 0.08 05/06/2018    PSA 0.07 01/29/2018    PSA 0.11 09/17/2017    PSA 0.09 06/04/2017    PSA 0.08 02/19/2017    PSA 0.06 11/13/2016             Assessment and Plan:  Problem   Prostate Cancer ([facility])    Fusion biopsy 06/10/16  Gleason 3+3=6 right prostate 1/6  Gleason 3+4=7 left prostate involving  4/6 cores    07/31/2016: RALP, BPLND, BWD Gleason 4+3=7 with tertiary 5, pT3aN0 (0/29)MxR1 (2mm pattern 4 and 5 at margin).    09/17/2019, PSA 0.17ng/ml, outside lab  03/02/2020, PSA 0.13ng.ml, outside lab           Prostate cancer [facility])  I had the pleasure of visiting with Mr. Amjad in clinic today.  He does have some urgency.  He urinates every 2 hours during the nighttime.  He does feel that he empties his bladder after urination.  He describes his stream is a good full stream.  He continues to wear pads, 5-6 a day, they are typically damp.  Most recent PSA was performed on 10/17/2021, PSA was 0.21 ng/mL, this is a stable value and same as previous value.  I advised we will continue to monitor his PSA every 6 months, he was agreeable with this plan.    He did receive salvage radiation, radiation occurred on 10/16/2017 through 12/05/2017, he did not receive any ADT therapy. He received radiation to  the prostate fossa, this was completed on 12/05/2017 for rising PSA.  Following radiation, he had a decline in the PSA with the slow rise subsequently.  Most recent PSA was performed on 11/08/2021, PSA was 0.21 ng/L, this is a stable value same as previous value.    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 He had a a CT scan performed on 08/18/2020 for his hematuria, it was negative.    He did have a episode of hematuria about 6 weeks ago.  I advised I would consult with Dr. Jimmey Ralph.  I did advise if he had any future hematuria events to please let us know, he verbalized understanding.    Plan:  1.  Return to clinic in 6 months with a PSA      [place], APRN-NP  Urology

## 2021-11-21 ENCOUNTER — Encounter: Admit: 2021-11-21 | Discharge: 2021-11-21 | Payer: MEDICARE

## 2021-12-05 ENCOUNTER — Encounter: Admit: 2021-12-05 | Discharge: 2021-12-05 | Payer: MEDICARE

## 2021-12-05 NOTE — Telephone Encounter
I called and spoke with Mr. Kenneth Macdonald as I sent him a MyChart message regarding follow-up as I consulted with Dr. Jerline Pain regarding his hematuria. He had not read his [employer].  He had had work-up performed, Dr. Jerline Pain advised as long as he is voiding well, we will continue to observe.    I did advise if he had any changes or any questions to please let us know.  He appreciated the phone call.  He did not have any questions.

## 2022-05-21 ENCOUNTER — Encounter: Admit: 2022-05-21 | Discharge: 2022-05-21 | Payer: MEDICARE

## 2022-05-21 ENCOUNTER — Ambulatory Visit: Admit: 2022-05-21 | Discharge: 2022-05-21 | Payer: MEDICARE

## 2022-05-21 DIAGNOSIS — C61 Malignant neoplasm of prostate: Secondary | ICD-10-CM

## 2022-05-21 LAB — PROSTATIC SPECIFIC ANTIGEN-PSA: PROSTATIC SPEC AG: 0.3 ng/mL (ref <6.01)

## 2022-05-22 ENCOUNTER — Encounter: Admit: 2022-05-22 | Discharge: 2022-05-22 | Payer: MEDICARE

## 2022-05-22 DIAGNOSIS — I10 Essential (primary) hypertension: Secondary | ICD-10-CM

## 2022-05-22 DIAGNOSIS — C4491 Basal cell carcinoma of skin, unspecified: Secondary | ICD-10-CM

## 2022-05-22 DIAGNOSIS — I719 Aortic aneurysm of unspecified site, without rupture: Secondary | ICD-10-CM

## 2022-05-22 DIAGNOSIS — R972 Elevated prostate specific antigen [PSA]: Secondary | ICD-10-CM

## 2022-05-22 DIAGNOSIS — E785 Hyperlipidemia, unspecified: Secondary | ICD-10-CM

## 2022-05-22 DIAGNOSIS — N529 Male erectile dysfunction, unspecified: Secondary | ICD-10-CM

## 2022-05-22 DIAGNOSIS — G4733 Obstructive sleep apnea (adult) (pediatric): Secondary | ICD-10-CM

## 2022-05-22 DIAGNOSIS — N400 Enlarged prostate without lower urinary tract symptoms: Secondary | ICD-10-CM

## 2022-05-22 DIAGNOSIS — C61 Malignant neoplasm of prostate: Secondary | ICD-10-CM

## 2022-05-22 DIAGNOSIS — K219 Gastro-esophageal reflux disease without esophagitis: Secondary | ICD-10-CM

## 2022-05-22 NOTE — Progress Notes
Date of Service: 05/22/2022    Subjective:             Kenneth Macdonald is a 76 y.o. male.    History of Present Illness  Kenneth Macdonald is a 76 y.o. male who has a history of prostate cancer having been treated with an anterior approach prostatectomy with bilateral wide dissection in February 2018.  He had grade group 3 disease, pT3a N0 R1.  He then completed salvage radiation to the prostate fossa in June 2019 for rising PSA.  He did have initial PSA response however his PSA is subsequently started to rise.  In November 2022 his PSA had risen to 0.21 making the definition of nonmetastatic biochemical recurrence.  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  He has had no recurrence of hematuria.  He does continue to be incontinent and goes through around 7-8 pads per day.  He states he changes them once they are mildly damp.  He has been previously counseled on anti-incontinence procedures but is not interested in them at this time.     Review of Systems   Constitutional: Negative.    HENT: Negative.    Eyes: Negative.    Respiratory: Negative.    Cardiovascular: Negative.    Gastrointestinal: Negative.    Endocrine: Negative.    Genitourinary: Negative.    Musculoskeletal: Negative.    Skin: Negative.    Allergic/Immunologic: Negative.    Neurological: Negative.    Hematological: Negative.    Psychiatric/Behavioral: Negative.          Objective:          ACETAMINOPHEN (TYLENOL PO) Take 1,000 mg by mouth daily as needed.    ascorbic acid (VITAMIN C PO) Take 1 tablet by mouth daily.    aspirin EC 81 mg tablet Take one tablet by mouth daily. Take with food.    cholecalciferol (VITAMIN D-3) 1,000 units tablet Take five tablets by mouth daily.    ergocalciferol (vitamin D2) (VITAMIN D PO) Take 1 tablet by mouth daily.    FOLIC ACID/MULTIVIT-MIN/LUTEIN (CENTRUM SILVER PO) Take 1 tablet by mouth daily.    hyoscyamine sulfate (LEVSIN/SL) 0.125 mg sublingual tablet Place one tablet under tongue every 4 hours as needed (Bladder Spasms, urinary urgency/frequency (mild)).    lisinopril-hydrochlorothiazide (PRINZIDE, ZESTORETIC) 20-12.5 mg tablet Take one tablet by mouth every morning.    loratadine (CLARITIN) 10 mg tablet Take one tablet by mouth every morning.    omeprazole DR(+) (PRILOSEC) 20 mg capsule Take one capsule by mouth daily before breakfast.    rosuvastatin (CRESTOR) 40 mg tablet Take one tablet by mouth daily. Takes half a tablet a day    simvastatin (ZOCOR) 40 mg tablet Take two tablets by mouth at bedtime daily.    vitamin E 400 unit capsule Take one capsule by mouth daily.     Vitals:    05/22/22 1048   PainSc: Zero     There is no height or weight on file to calculate BMI.     Physical Exam  Well developed, well appearing man in no acute distress.  He appears his stated age.    Breathing is non-labored, chest rise is equal bilaterally.  Neurologic exam is non-focal.  Mood and affect are normal.    Lab Results   Component Value Date    PSA 0.35 05/21/2022    PSA 0.21 11/08/2021  PSA 0.21 07/06/2021    PSA 0.21 04/19/2021    PSA 0.18 11/10/2020          Assessment and Plan:  Prostate cancer  -PSA reviewed from today.  His PSA has risen to 0.35.  I recommend that we recheck the PSA in approximately 2 to 4 weeks.  If it does remain around this level, I would recommend a PSMA PET scan given the trajectory of his PSA change.  He voiced understand this.  Otherwise we will schedule return to clinic in 3 months with a PSA.

## 2022-06-19 ENCOUNTER — Ambulatory Visit: Admit: 2022-06-19 | Discharge: 2022-06-19 | Payer: MEDICARE

## 2022-06-19 ENCOUNTER — Encounter: Admit: 2022-06-19 | Discharge: 2022-06-19 | Payer: MEDICARE

## 2022-06-19 DIAGNOSIS — C61 Malignant neoplasm of prostate: Secondary | ICD-10-CM

## 2022-06-19 LAB — PROSTATIC SPECIFIC ANTIGEN-PSA: PROSTATIC SPEC AG: 0.3 ng/mL (ref <6.01)

## 2022-06-20 ENCOUNTER — Encounter: Admit: 2022-06-20 | Discharge: 2022-06-20 | Payer: MEDICARE

## 2022-06-20 DIAGNOSIS — C61 Malignant neoplasm of prostate: Secondary | ICD-10-CM

## 2022-07-16 ENCOUNTER — Encounter: Admit: 2022-07-16 | Discharge: 2022-07-16 | Payer: MEDICARE

## 2022-07-16 DIAGNOSIS — C61 Malignant neoplasm of prostate: Secondary | ICD-10-CM

## 2022-07-16 MED ORDER — RP DX F-18 PIFLUFOLASTAT (PSMA) 1 MCI
9 | Freq: Once | INTRAVENOUS | 0 refills | Status: CP
Start: 2022-07-16 — End: ?

## 2022-07-17 ENCOUNTER — Encounter: Admit: 2022-07-17 | Discharge: 2022-07-17 | Payer: MEDICARE

## 2022-07-18 ENCOUNTER — Encounter: Admit: 2022-07-18 | Discharge: 2022-07-18 | Payer: MEDICARE

## 2022-07-18 DIAGNOSIS — K769 Liver disease, unspecified: Secondary | ICD-10-CM

## 2022-07-18 DIAGNOSIS — C61 Malignant neoplasm of prostate: Secondary | ICD-10-CM

## 2022-07-18 NOTE — Progress Notes
Interventional Radiology Outpatient Procedure Review Assessment        Reason for consult: Evaluation for liver mass biopsy.      History of present illness:  Kenneth Macdonald is a 77 y.o. male patient with a history significant for prostate cancer.      Assessment:   - Review of imaging: PET/CT 07/16/2022.  Please reference the tracer avid lesion within the right hepatic lobe.  - Review of recent labs and allergies: Reviewed.  No pertinent drug allergies.  - Review of anticoagulation medications: None.  - Previous ASA score if available: 3 (If ASA IV, anesthesia will run sedation for procedure if sedation indicated).  - Previous IR Anesthetic/Sedation History (if applicable): n/a  - Case discussed with referring Physician (Y/N): n  - Contraindications to procedure: n      Plan:  - This case has been reviewed and approved for percutaneous US-guided right liver mass biopsy.  - Patient position and modality: Supine, [place]  - Intra-procedural Sedation/Medication Plan: Moderate sedation  - Procedure time frame: 10-14 days  - Anticoagulation: Per IR peri procedural anticoagulation management protocol  - Labs: Per IR pre procedural lab protocol  - Additional requests after review: None    Lab/Radiology/Other Diagnostic Tests:  Labs:  Pertinent labs reviewed             Emily Filbert, MD

## 2022-07-18 NOTE — Telephone Encounter
Spoke with patient to coordinate necessary lab draw. Patient is driving to [place], [place] today and can get bloodwork there. Will fax orders to:     [facility]  [place] [address] [place], [place]  Open Monday-Friday, [place] - 5:30 PM  Ph: [phone]  Fax: [phone]    Per patient request/preference. He plans to have them done around 1pm today.He will call if there's any difficulty at the lab or if location needs to change. Will follow up on results.

## 2022-07-19 ENCOUNTER — Encounter: Admit: 2022-07-19 | Discharge: 2022-07-19 | Payer: MEDICARE

## 2022-07-19 DIAGNOSIS — K769 Liver disease, unspecified: Secondary | ICD-10-CM

## 2022-07-19 DIAGNOSIS — C61 Malignant neoplasm of prostate: Secondary | ICD-10-CM

## 2022-07-19 NOTE — Patient Education
Dear Kenneth Macdonald,    Thank you for choosing [facility] Interventional Radiology for your procedure. Your appointment information is listed below:    Appointment Date: 07-31-22  Arrival Time: Please arrive for Check-in at: 10am  Appointment Time: 11am  Location:   [facility]: [address], [place], [place] [address]   Parking: available in the front of the building      INTERVENTIONAL RADIOLOGY  PRE-PROCEDURE INSTRUCTIONS SEDATION    You are scheduled for a procedure in Interventional Radiology with procedural sedation.  Please follow these instructions and any direction from your Primary Care/Managing Physician.  If you have questions about your procedure or need to reschedule please call [phone].    Medication Instructions:   You may take the following medications with a small sip of water:  Continue all medications as prescribed before 9am.     Diet Instructions:  (8) hours 3am before your procedure, stop your regular diet and start a clear liquid diet.  (6) hours before your procedure, discontinue tube feedings and chewing tobacco.  (2) hours 9am before your procedure discontinue clear liquids.  You should have nothing by mouth. This includes GUM or CANDY.     Clear Liquid Diet    Water  Apple or White Grape Juice  Coffee or tea without cream   Tea  White Cranberry Juice  Chicken Bouillon or Broth (no noodles)  Soda Pop  Popsicles   Beef Bouillon or Broth (no noodles)    Day of Exam Instructions:  Bathe or shower with an antibacterial soap prior to your appointment.  If you have a history of Obstructive Sleep Apnea (OSA) bring your CPAP/BIPAP.   Bring a list of your current medications and the dosages.  Wear comfortable clothing and leave valuables at home.  Arrive (1) hour prior to your appointment.  This time will be spent registering, interviewing, assessing, educating and preparing you for the test.  You will be with [place] anywhere from 30 minutes to 6 hours after your exam depending on your procedure.  You will be sedated for the procedure. A responsible adult must drive you home (no Benedetto Goad, taxis or buses are allowed) and stay with you overnight. If you do not have a driver we will be unable to perform your procedure.   You will not be able to return to work or drive the same day if receiving sedation.

## 2022-07-19 NOTE — Progress Notes
Interventional Radiology Outpatient Scheduling Checklist      1.  Name of Procedure(s):   LIVER MASS BX  07/18/2022 10:10 AM CST by Francisco Capuchin, MD    Approved? Yes   Attending Provider Reviewing the Case: [place]   Case Type: Body   Modality: Ultrasound   Position: Supine   Sedation Type: Moderate   Preferred Location: Any   Physician: Any   Recommended Specimen Core   IR Assessment & plan note completed Yes       2.  Date of Procedure:   07-31-22      3.  Arrival Time:   1000      4.  Procedure Time:  1100      5.  Correct Procedural Room Assignment:  ICC 3      6.  Blood Thinners Triaged and instructed per protocol: Y/N/NA:  NA  Confirmed accurate instructions sent to patient: Y/N:  NA       7.  Procedure Order Verified: Y/N:  Yes    8.  Patient instructed to have a driver: Y/N/NA:  Yes    9.  Patient instructed on NPO status: Y/N/NA:  Yes  Confirmed accurate instructions sent to patient: Y/N:  Yes    10.  Specimen needed: Y/N/NA:  Yes   Verified Order placed: Y/N:  Yes    11.  Allergies Verified:  Y/N:  Yes    12.  Has the patient ever had a procedure with contrast (heart cath, CT scan, IVP, etc)? N/A    13. Has the patient ever had an adverse reaction to iodine or iodinated contrast, including but not limited to trouble breathing, swelling around the face or throat, itching, or rash? N/A    14. Has the allergy list been updated? Yes    15.  Does the patient have labs according to IR Pre-procedure Laboratory Parameter policy: Y/N/NA:  Yes  If No, was the patient instructed to obtain labs prior to procedure: Y/N/NA:  NA     16.  Will the patient need to be admitted or have a possible admission: Y/N:  No  If yes, confirmed accurate instructions sent to patient: Y/N/NA:  NA     17.  Patient States Understanding: Y/N:  Yes    18.  History of OSA:  Y/N:  Yes  If yes, confirm request to bring CPAP sent to patient: Y/N/NA:  Yes    19. Does the patient have an insulin pump or continuous glucose monitor? No   If yes, was the patient instructed that this will need to be removed for this procedure and to bring supplies to reapply once the procedure is complete? Y/N    20. Patient was sent electronic procedure instructions: Y/N:  Yes     21.  Is patient scheduled with anesthesia?  No         Taking GLP-1 agonist (Ozempic, [place], [place]) Y/N         Weekly hold for a week         Daily hold day of procedure

## 2022-07-20 ENCOUNTER — Ambulatory Visit: Admit: 2022-07-20 | Discharge: 2022-07-20 | Payer: MEDICARE

## 2022-07-20 ENCOUNTER — Encounter: Admit: 2022-07-20 | Discharge: 2022-07-20 | Payer: MEDICARE

## 2022-07-20 DIAGNOSIS — I10 Essential (primary) hypertension: Secondary | ICD-10-CM

## 2022-07-20 DIAGNOSIS — R972 Elevated prostate specific antigen [PSA]: Secondary | ICD-10-CM

## 2022-07-20 DIAGNOSIS — I719 Aortic aneurysm of unspecified site, without rupture: Secondary | ICD-10-CM

## 2022-07-20 DIAGNOSIS — N400 Enlarged prostate without lower urinary tract symptoms: Secondary | ICD-10-CM

## 2022-07-20 DIAGNOSIS — E785 Hyperlipidemia, unspecified: Secondary | ICD-10-CM

## 2022-07-20 DIAGNOSIS — N529 Male erectile dysfunction, unspecified: Secondary | ICD-10-CM

## 2022-07-20 DIAGNOSIS — C4491 Basal cell carcinoma of skin, unspecified: Secondary | ICD-10-CM

## 2022-07-20 DIAGNOSIS — K219 Gastro-esophageal reflux disease without esophagitis: Secondary | ICD-10-CM

## 2022-07-20 DIAGNOSIS — G4733 Obstructive sleep apnea (adult) (pediatric): Secondary | ICD-10-CM

## 2022-07-31 ENCOUNTER — Encounter: Admit: 2022-07-31 | Discharge: 2022-07-31 | Payer: MEDICARE

## 2022-07-31 ENCOUNTER — Ambulatory Visit: Admit: 2022-07-31 | Discharge: 2022-07-31 | Payer: MEDICARE

## 2022-07-31 DIAGNOSIS — I10 Essential (primary) hypertension: Secondary | ICD-10-CM

## 2022-07-31 DIAGNOSIS — N400 Enlarged prostate without lower urinary tract symptoms: Secondary | ICD-10-CM

## 2022-07-31 DIAGNOSIS — I719 Aortic aneurysm of unspecified site, without rupture: Secondary | ICD-10-CM

## 2022-07-31 DIAGNOSIS — N529 Male erectile dysfunction, unspecified: Secondary | ICD-10-CM

## 2022-07-31 DIAGNOSIS — E785 Hyperlipidemia, unspecified: Secondary | ICD-10-CM

## 2022-07-31 DIAGNOSIS — R972 Elevated prostate specific antigen [PSA]: Secondary | ICD-10-CM

## 2022-07-31 DIAGNOSIS — C61 Malignant neoplasm of prostate: Secondary | ICD-10-CM

## 2022-07-31 DIAGNOSIS — K219 Gastro-esophageal reflux disease without esophagitis: Secondary | ICD-10-CM

## 2022-07-31 DIAGNOSIS — G4733 Obstructive sleep apnea (adult) (pediatric): Secondary | ICD-10-CM

## 2022-07-31 DIAGNOSIS — C4491 Basal cell carcinoma of skin, unspecified: Secondary | ICD-10-CM

## 2022-07-31 MED ORDER — MIDAZOLAM 1 MG/ML IJ SOLN
1 mg | Freq: Once | INTRAVENOUS | 0 refills | Status: CP
Start: 2022-07-31 — End: ?
  Administered 2022-07-31: 17:00:00 1 mg via INTRAVENOUS

## 2022-07-31 MED ORDER — SODIUM CHLORIDE 0.9 % IV SOLP
1000 mL | Freq: Once | INTRAVENOUS | 0 refills | Status: CP
Start: 2022-07-31 — End: ?
  Administered 2022-07-31: 17:00:00 500 mL via INTRAVENOUS

## 2022-07-31 MED ORDER — NALOXONE 0.4 MG/ML IJ SOLN
.08 mg | INTRAVENOUS | 0 refills | Status: DC | PRN
Start: 2022-07-31 — End: 2022-07-31

## 2022-07-31 MED ORDER — FENTANYL CITRATE (PF) 50 MCG/ML IJ SOLN
0 refills | Status: CP
Start: 2022-07-31 — End: ?

## 2022-07-31 MED ORDER — MIDAZOLAM 1 MG/ML IJ SOLN
0 refills | Status: CP
Start: 2022-07-31 — End: ?

## 2022-07-31 MED ORDER — FLUMAZENIL 0.1 MG/ML IV SOLN
.2 mg | INTRAVENOUS | 0 refills | Status: DC | PRN
Start: 2022-07-31 — End: 2022-07-31

## 2022-07-31 NOTE — Progress Notes
Sedation physician present in room. Recent vitals and patient condition reviewed between sedating physician and nurse. Reassessment completed. Determination made to proceed with planned sedation.

## 2022-07-31 NOTE — Other
Immediate Post Procedure Note    Date:  07/31/2022                                           Attending Physician:   Alene Mires  Assistant(s):  Danny    Procedure(s):  U/S - guided liver mass bx  Indications:  mass  Findings:  above  Anesthesia: Local 10 mL 1% lidocaine without epinephrine with IV sedation .  Sedation/Medication Plan: Fentanyl and Midazolam    Time out performed: Consent obtained, correct patient verified, correct procedure verified, correct site verified, patient marked as necessary.  Estimated Blood Loss:  None/Negligible  Specimen(s) Removed/Disposition:  Yes, sent to pathology  Complications: None  Comments:    Etter Sjogren, MD

## 2022-07-31 NOTE — H&P (View-Only)
Pre Procedure History and Physical/Sedation Plan-OP    Procedure Date: 07/31/2022     Planned Procedure(s):  Liver mass biopsy     Indication for exam: Hepatic mass   __________________________________________________________________    Chief Complaint:  Prostate Ca with liver mass    History of Present Illness: Kenneth Macdonald is a 77 y.o. male.    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    Prostate cancer (HCC) 07/19/2016     Medical History:   Diagnosis Date    Aortic aneurysm (HCC) ~2013    Per patient very small    BCC (basal cell carcinoma of skin) ~1990    Lip    Dyslipidemia     Elevated PSA     Enlarged prostate     Erectile dysfunction     GERD (gastroesophageal reflux disease)     Hypertension     OSA on CPAP       Surgical History:   Procedure Laterality Date    ROBOTIC PROSTATECTOMY LAPAROSCOPY, BILATERAL PELVIC NODE DISSECTION, BILATERAL WIDE DISSECTION N/A 07/31/2016    Performed by Ross Marcus, MD at [facility] OR    insicional HERNIA UMBILICAL N/A 08/03/2016    Performed by Liborio Nixon, MD at [facility] OR    CYSTOURETHEROSCOPY WITH BIOPSY and Fulguration N/A 10/10/2020    Performed by Myna Hidalgo, MD at [facility] OR    KNEE ARTHROSCOPY Right ~2010    SKIN CANCER EXCISION  ~1990    BCC Lip      Medications Prior to Admission   Medication Sig Dispense Refill Last Dose    ACETAMINOPHEN (TYLENOL PO) Take 1,000 mg by mouth daily as needed.   Past Week    ascorbic acid (VITAMIN C PO) Take 1 tablet by mouth daily.   07/30/2022    aspirin EC 81 mg tablet Take one tablet by mouth daily. Take with food.   Unknown    cholecalciferol (VITAMIN D-3) 1,000 units tablet Take five tablets by mouth daily.   Past Week    ergocalciferol (vitamin D2) (VITAMIN D PO) Take 1 tablet by mouth daily.   Past Week    FOLIC ACID/MULTIVIT-MIN/LUTEIN (CENTRUM SILVER PO) Take 1 tablet by mouth daily.   Past Week    hyoscyamine sulfate (LEVSIN/SL) 0.125 mg sublingual tablet Place one tablet under tongue every 4 hours as needed (Bladder Spasms, urinary urgency/frequency (mild)). 30 tablet 1 Past Week    lisinopril-hydrochlorothiazide (PRINZIDE, ZESTORETIC) 20-12.5 mg tablet Take one tablet by mouth every morning.   07/31/2022    loratadine (CLARITIN) 10 mg tablet Take one tablet by mouth every morning.   07/31/2022    omeprazole DR(+) (PRILOSEC) 20 mg capsule Take one capsule by mouth daily before breakfast.   07/31/2022    rosuvastatin (CRESTOR) 40 mg tablet Take one tablet by mouth daily. Takes half a tablet a day   07/30/2022    simvastatin (ZOCOR) 40 mg tablet Take two tablets by mouth at bedtime daily.   07/30/2022    vitamin E 400 unit capsule Take one capsule by mouth daily.   07/30/2022     Allergies   Allergen Reactions    Fish Containing Products SWOLLEN TONGUE    Ciprofloxacin HIVES and ITCHING    Levaquin [Levofloxacin] RASH and ITCHING       Social History:   Social History     Tobacco Use  Smoking status: Former     Packs/day: 1.00     Years: 20.00     Additional pack years: 0.00     Total pack years: 20.00     Types: Cigarettes     Quit date: 07/19/2001     Years since quitting: 21.0    Smokeless tobacco: Never   Substance Use Topics    Alcohol use: No      Family History   Problem Relation Age of Onset    Cancer Mother     Cancer Father     Cancer Sister     Diabetes Sister     Cancer Brother     Cancer-Prostate Brother     Diabetes Brother         Review of Systems  All other systems reviewed and are negative.    Previous Anesthetic/Sedation History:  reviewed    Code Status: Full Code    Physical Exam:  Vital Signs: Last Filed In 24 Hours Vital Signs: 24 Hour Range   Temp: 36.7 ?C (98 ?F) (02/13 1027)  Pulse: 74 (02/13 1034)  Respirations: 12 PER MINUTE (02/13 1034)  SpO2: 92 % (02/13 1034)  O2 Device: None (Room air) (02/13 1027)  SpO2 Pulse: 92 (02/13 1027) Temp:  [36.7 ?C (98 ?F)]   Pulse:  [74]   Respirations:  [12 PER MINUTE-22 PER MINUTE]   SpO2:  [92 %]   O2 Device: None (Room air)            General appearance: alert and no distress  Neurologic: Grossly normal, at baseline  Lungs: Nonlabored with normal effort  Abdomen: soft, non-tender.         Airway:  airway assessment performed  Mallampati II (soft palate, uvula, fauces visible)   Anesthesia Classification:  ASA III (A patient with a severe systemic disease that limits activity, but is not incapacitating)  Pre procedure anxiolysis plan: Midazolam  Sedation/Medication Plan: Fentanyl, Lidocaine, and Midazolam  Personal history of sedation complications: Denies adverse event.   Family history of sedation complications: Denies adverse event.   Medications for Reversal: Naloxone and Flumazenil  Discussion/Reviews:  Physician has discussed risks and alternatives of this type of sedation and above planned procedures with patient  NPO Status: Acceptable  Pregnancy Status: Not Pregnant      Pt is not on a therapeutic blood thinner  Lab/Radiology/Other Diagnostic Tests:  Labs:  Hematology:    Lab Results   Component Value Date    HGB 12.4 08/03/2016    HCT 38.6 08/03/2016    PLTCT 216 08/03/2016    WBC 10.5 08/03/2016    NEUT 86 08/03/2016    ANC 9.10 08/03/2016    ALC 0.90 08/03/2016    MONA 5 08/03/2016    AMC 0.50 08/03/2016    EOSA 0 08/03/2016    ABC 0.00 08/03/2016    MCV 92.9 08/03/2016    MCH 29.9 08/03/2016    MCHC 32.2 08/03/2016    MPV 7.5 08/03/2016    RDW 14.0 08/03/2016   , Coagulation:  No results found for: PT, PTT, INR, and General Chemistry:    Lab Results   Component Value Date    NA 137 08/03/2016    K 3.7 08/03/2016    CL 103 08/03/2016    CO2 25 08/03/2016    GAP 9 08/03/2016    BUN 27 08/03/2016    CR 0.9 08/18/2020    CR 1.02 08/03/2016    GLU 157 08/03/2016  CA 8.7 08/03/2016    ALBUMIN 4.3 08/03/2016    MG 2.3 08/03/2016    TOTBILI 0.9 08/03/2016              Philmore Pali, APRN-NP  Pager [phone]

## 2022-08-02 ENCOUNTER — Encounter: Admit: 2022-08-02 | Discharge: 2022-08-02 | Payer: MEDICARE

## 2022-08-02 DIAGNOSIS — C220 Liver cell carcinoma: Secondary | ICD-10-CM

## 2022-08-02 NOTE — Telephone Encounter
I called and relayed the pathology results.  I explained that it shows a well-differentiated hepatocellular carcinoma. I recommended a referral to our hepatobiliary team.

## 2022-08-03 ENCOUNTER — Encounter: Admit: 2022-08-03 | Discharge: 2022-08-03 | Payer: MEDICARE

## 2022-08-03 NOTE — Telephone Encounter
[facility] Surgery Referral Summary    Patient Name:    DOB:    Insurance:     Provider Info --   - Referring: Debbe Mounts, urology   - PCP:    Radiology/Facility:PET 07/16/22    Pathology/Facility: liver bx:  [facility]    Endoscopy/Facility: na    Reason for Visit/Diagnosis: [place]    HPI Summary:prostate CA w/ sx,radiation. Incontinence,  HTN,HLD.from a prostate cancer standpoint we need to monitor the PSA with serial evaluations every 3-6 months with a plan to repeat imaging if the PSA reaches 0.7.     Appointment Needs --   - Provider: Stevie Kern   - Urgency: next    - Department: [facility]   - Other (Interpreter, Transportation, Financial, Other Consults)

## 2022-08-06 ENCOUNTER — Encounter: Admit: 2022-08-06 | Discharge: 2022-08-06 | Payer: MEDICARE

## 2022-08-09 ENCOUNTER — Encounter: Admit: 2022-08-09 | Discharge: 2022-08-09 | Payer: MEDICARE

## 2022-08-09 ENCOUNTER — Inpatient Hospital Stay: Admit: 2022-08-09 | Discharge: 2022-08-09 | Payer: MEDICARE

## 2022-08-09 ENCOUNTER — Ambulatory Visit: Admit: 2022-08-09 | Discharge: 2022-08-10 | Payer: MEDICARE

## 2022-08-09 DIAGNOSIS — C220 Liver cell carcinoma: Secondary | ICD-10-CM

## 2022-08-09 DIAGNOSIS — E785 Hyperlipidemia, unspecified: Secondary | ICD-10-CM

## 2022-08-09 DIAGNOSIS — I719 Aortic aneurysm of unspecified site, without rupture: Secondary | ICD-10-CM

## 2022-08-09 DIAGNOSIS — K219 Gastro-esophageal reflux disease without esophagitis: Secondary | ICD-10-CM

## 2022-08-09 DIAGNOSIS — R160 Hepatomegaly, not elsewhere classified: Secondary | ICD-10-CM

## 2022-08-09 DIAGNOSIS — N529 Male erectile dysfunction, unspecified: Secondary | ICD-10-CM

## 2022-08-09 DIAGNOSIS — G4733 Obstructive sleep apnea (adult) (pediatric): Secondary | ICD-10-CM

## 2022-08-09 DIAGNOSIS — I10 Essential (primary) hypertension: Secondary | ICD-10-CM

## 2022-08-09 DIAGNOSIS — N400 Enlarged prostate without lower urinary tract symptoms: Secondary | ICD-10-CM

## 2022-08-09 DIAGNOSIS — C4491 Basal cell carcinoma of skin, unspecified: Secondary | ICD-10-CM

## 2022-08-09 DIAGNOSIS — R972 Elevated prostate specific antigen [PSA]: Secondary | ICD-10-CM

## 2022-08-09 NOTE — Progress Notes
Date of Service: 08/09/2022    Kenneth Macdonald is a 77 y.o. male.    Subjective:             History of Present Illness  Kenneth Macdonald is a 77 year old male who presents as a new patient to discuss recently diagnosed liver mass. He has a history of prostate cancer and underwent prostatectomy in 2018 and salvage radiation in 2019 for rising PSA. He is followed by Dr Jimmey Ralph in [facility]. His PSA in December had risen to 0.35 and again in January to 0.39. 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 He underwent US guided biopsy 07/31/22 which revealed well differentiated HCC.     He denies MI, stroke, or blood clots. He uses a cane to help him steady at times but not always. He says he can walk up a flight of stairs without dyspnea. No known COPD.    He denies abdominal pain, nausea, vomiting, early satiety, or any other symptoms.   PSH: robotic prostatectomy 2018, incisional hernia repair primarily 2018  PMH: prostate cancer, HLD, HTN, OSA   Meds: denies the use of any blood thinners, no longer on ASA  Social: remote tobacco use history, remote social alcohol use history        Review of Systems  A 12 point review of symptoms was performed and was negative unless otherwise noted in the HPI.    Medical History:   Diagnosis Date    Aortic aneurysm (HCC) ~2013    Per patient very small    BCC (basal cell carcinoma of skin) ~1990    Lip    Dyslipidemia     Elevated PSA     Enlarged prostate     Erectile dysfunction     GERD (gastroesophageal reflux disease)     Hypertension     OSA on CPAP      Surgical History:   Procedure Laterality Date    ROBOTIC PROSTATECTOMY LAPAROSCOPY, BILATERAL PELVIC NODE DISSECTION, BILATERAL WIDE DISSECTION N/A 07/31/2016    Performed by Ross Marcus[facility] MD at [facility] OR    insicional HERNIA UMBILICAL N/A 08/03/2016    Performed by Liborio Nixon, MD at [facility] OR CYSTOURETHEROSCOPY WITH BIOPSY and Fulguration N/A 10/10/2020    Performed by Myna Hidalgo, MD at [facility] OR    KNEE ARTHROSCOPY Right ~2010    SKIN CANCER EXCISION  ~1990    BCC Lip     Family History   Problem Relation Age of Onset    Cancer Mother     Cancer Father     Cancer Sister     Diabetes Sister     Cancer Brother     Cancer-Prostate Brother     Diabetes Brother      Social History     Socioeconomic History    Marital status: Married   Tobacco Use    Smoking status: Former     Current packs/day: 0.00     Average packs/day: 1 pack/day for 20.0 years (20.0 ttl pk-yrs)     Types: Cigarettes     Start date: 07/19/1981     Quit date: 07/19/2001     Years since quitting: 21.0    Smokeless tobacco: Never   Vaping Use    Vaping status: Never Used   Substance and Sexual Activity  Alcohol use: No    Drug use: No     Vaping/E-liquid Use    Vaping Use Never User                   Objective:          ACETAMINOPHEN (TYLENOL PO) Take 1,000 mg by mouth daily as needed.    ascorbic acid (VITAMIN C PO) Take 1 tablet by mouth daily.    aspirin EC 81 mg tablet Take one tablet by mouth daily. Take with food.    cholecalciferol (VITAMIN D-3) 1,000 units tablet Take five tablets by mouth daily.    ergocalciferol (vitamin D2) (VITAMIN D PO) Take 1 tablet by mouth daily.    FOLIC ACID/MULTIVIT-MIN/LUTEIN (CENTRUM SILVER PO) Take 1 tablet by mouth daily.    hyoscyamine sulfate (LEVSIN/SL) 0.125 mg sublingual tablet Place one tablet under tongue every 4 hours as needed (Bladder Spasms, urinary urgency/frequency (mild)).    lisinopril-hydrochlorothiazide (PRINZIDE, ZESTORETIC) 20-12.5 mg tablet Take one tablet by mouth every morning.    loratadine (CLARITIN) 10 mg tablet Take one tablet by mouth every morning.    omeprazole DR(+) (PRILOSEC) 20 mg capsule Take one capsule by mouth daily before breakfast.    rosuvastatin (CRESTOR) 40 mg tablet Take one tablet by mouth daily. Takes half a tablet a day    simvastatin (ZOCOR) 40 mg tablet Take two tablets by mouth at bedtime daily.    vitamin E 400 unit capsule Take one capsule by mouth daily.     Vitals:    08/09/22 1151   BP: 134/58   BP Source: Arm, Left Upper   Pulse: 70   Temp: 36.5 ?C (97.7 ?F)   Resp: 13   SpO2: 93%   TempSrc: Oral   PainSc: Zero   Weight: (!) 136.5 kg (301 lb)   Height: 185.4 cm (6' 1)     Body mass index is 39.71 kg/m?Marland Kitchen     Labs and Diagnostic Test:  MELD:       No data to display                Basic Labs:      Latest Ref Rng & Units 08/03/2016    11:58 PM 08/03/2016     3:55 PM   Basic Labs   Sodium 137 - 147 MMOL/L 137  137    Potassium 3.5 - 5.1 MMOL/L 3.7  3.7    Chloride 98 - 110 MMOL/L 103  99    Blood Urea Nitrogen 7 - 25 MG/DL 27  22    Creatinine 0.4 - 1.24 MG/DL 0.10  9.32    Glucose 70 - 100 MG/DL 355  732    Calcium 8.5 - 10.6 MG/DL 8.7  9.7    Total Protein 6.0 - 8.0 G/DL  7.8    Total Bilirubin 0.3 - 1.2 MG/DL  0.9    Albumin 3.5 - 5.0 G/DL  4.3    Alk Phosphatase 25 - 110 U/L  43    AST (SGOT) 7 - 40 U/L  81    ALT (SGPT) 7 - 56 U/L  71    EGFR Non African American >60 mL/min >60  >60    EGFR African American >60 mL/min >60  >60    Anion Gap 3 - 12 9  10     Lipase 11 - 82 U/L  16    White Blood Cells 4.5 - 11.0 K/UL 10.5  12.5    RBC  4.4 - 5.5 M/UL 4.15  4.57    Hemoglobin 13.5 - 16.5 GM/DL 45.4  09.8    MCV 80 - 100 FL 92.9  93.0    MCH 26 - 34 PG 29.9  30.3    MCHC 32.0 - 36.0 G/DL 11.9  14.7    Platelet Count 150 - 400 K/UL 216  242    MPV 7 - 11 FL 7.5  7.7    RDW 11 - 15 % 14.0  14.4    Neutrophils 41 - 77 % 86  79    Absolute Neutrophil Count 1.8 - 7.0 K/UL 9.10  9.80    Lymphocytes 24 - 44 % 9  14    Absolute Lymph Count 1.0 - 4.8 K/UL 0.90  1.70    Monocytes 4 - 12 % 5  7    Absolute Monocyte Count 0 - 0.80 K/UL 0.50  0.90    Absolute Eosinophil Count 0 - 0.45 K/UL 0.00  0.00    Basophils 0 - 2 % 0  0    Absolute Basophil Count 0 - 0.20 K/UL 0.00  0.00        Metabolic Labs:      Latest Ref Rng & Units 08/03/2016     3:55 PM   Metabolic Labs   Alk Phos 25 - 829 U/L 43        Autoimmune Labs:       No data to display                Hepatitis Labs:       No data to display                Drug Levels       No data to display                Imaging:  Results for orders placed during the hospital encounter of 08/18/20    CT ABD/PELV WO/W CONTRAST    Impression  1. Minimal nodularity along the posterior lumen of the urinary bladder on the delayed phase images, which is nonspecific and may be postsurgical though cystoscopy is recommended for further evaluation.    2. No renal calculi or filling defects within the opacified portions of the upper urothelial tract.    3. 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    4. Hepatomegaly with diffuse hepatic steatosis      Finalized by Lars Mage, M.D. on 08/18/2020 11:47 AM. Dictated by Lars Mage, M.D. on 08/18/2020 11:33 AM.        Physical Exam  Physical Exam:    Gen: A&Ox3, NAD  HEENT: Normocephalic, atraumatic.   Pulm: Unlabored on RA  CV: Regular rate  Abdomen: soft, non tender, non-distended. Obese. Well-healed vertical supraumbilical prostatectomy scar. Obese.   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 The cancer appears resectable. Given his normal LFTs/INR, he is a reasonable  candidate for right hepatic lobectomy, cholecystectomy. Discussed how his history of NASH puts him at higher risk of developing similar lesions in the future. Risks, benefits, and alternatives discussed and he elected to proceed with surgery. Consent obtained in clinic today  - Surgery date: 09/04/22  - PAT prior     Lyda Kalata, DO  General Surgery PGY-2       I personally performed the key portions of the E/M visit, discussed case with the resident and concur with the documentation of the history, physical exam, assessment, and treatment plan.  I have edited this note where appropriate.    Jimmy Footman, MD PhD  Transplant Surgery  Department of Surgery    Total Time Today was 65 minutes in the following activities: pre-visit evaluation of patient prior to visit, assessment of imaging and laboratory values, evaluation of the patient during the visit, and discussion of the risks, benefits, and complications.

## 2022-08-10 DIAGNOSIS — C220 Liver cell carcinoma: Secondary | ICD-10-CM

## 2022-08-13 ENCOUNTER — Encounter: Admit: 2022-08-13 | Discharge: 2022-08-13 | Payer: MEDICARE

## 2022-08-13 ENCOUNTER — Ambulatory Visit: Admit: 2022-08-13 | Discharge: 2022-08-14 | Payer: MEDICARE

## 2022-08-13 DIAGNOSIS — Z01818 Encounter for other preprocedural examination: Secondary | ICD-10-CM

## 2022-08-13 DIAGNOSIS — R972 Elevated prostate specific antigen [PSA]: Secondary | ICD-10-CM

## 2022-08-13 DIAGNOSIS — I719 Aortic aneurysm of unspecified site, without rupture: Secondary | ICD-10-CM

## 2022-08-13 DIAGNOSIS — G4733 Obstructive sleep apnea (adult) (pediatric): Secondary | ICD-10-CM

## 2022-08-13 DIAGNOSIS — C4491 Basal cell carcinoma of skin, unspecified: Secondary | ICD-10-CM

## 2022-08-13 DIAGNOSIS — K219 Gastro-esophageal reflux disease without esophagitis: Secondary | ICD-10-CM

## 2022-08-13 DIAGNOSIS — C61 Malignant neoplasm of prostate: Secondary | ICD-10-CM

## 2022-08-13 DIAGNOSIS — E785 Hyperlipidemia, unspecified: Secondary | ICD-10-CM

## 2022-08-13 DIAGNOSIS — I10 Essential (primary) hypertension: Secondary | ICD-10-CM

## 2022-08-13 DIAGNOSIS — N400 Enlarged prostate without lower urinary tract symptoms: Secondary | ICD-10-CM

## 2022-08-13 DIAGNOSIS — N529 Male erectile dysfunction, unspecified: Secondary | ICD-10-CM

## 2022-08-13 NOTE — Unmapped
[facility] Anesthesia Pre-Procedure Evaluation    Name: Kenneth Macdonald      MRN: [id number]     DOB: 1946-05-18     Age: 77 y.o.     Sex: male   _________________________________________________________________________     Procedure Info:   Procedure Information       Date/Time: 09/04/22 1050    Procedures:       HEPATECTOMY LOBECTOMY - TOTAL - RIGHT (Right) - 2 hrs      CHOLECYSTECTOMY    Location: MAIN OR 57 / Main OR/Periop    Surgeons: Jimmy Footman, MD            Physical Assessment  Vital Signs (last filed in past 24 hours):         Patient History   Allergies   Allergen Reactions    Fish Containing Products SWOLLEN TONGUE    Ciprofloxacin HIVES and ITCHING    Levaquin [Levofloxacin] RASH and ITCHING        Current Medications    Medication Directions   acetaminophen (TYLENOL EXTRA STRENGTH) 500 mg tablet Take one tablet by mouth every 6 hours as needed for Pain. Max of 4,000 mg of acetaminophen in 24 hours.   ascorbic acid (vitamin C) 500 mg tablet Take one tablet by mouth daily.   calcium citrate (CALCITRATE) 950 mg tablet Take one tablet by mouth daily.   cholecalciferol (VITAMIN D-3) 1,000 units tablet Take one tablet by mouth daily.   fluticasone propionate (FLONASE) 50 mcg/actuation nasal spray, suspension Apply two sprays to each nostril as directed daily as needed.   lactobacillus rhamnosus GG (CULTURELLE) 10 billion cell capsule Take one capsule by mouth daily.   lisinopril-hydrochlorothiazide (PRINZIDE, ZESTORETIC) 20-12.5 mg tablet Take one tablet by mouth every morning.   loratadine (CLARITIN) 10 mg tablet Take one tablet by mouth every morning.   magnesium oxide (MAGOX) 400 mg (241.3 mg magnesium) tablet Take one tablet by mouth daily.   multivitamin (ONE-A-DAY) tablet Take one tablet by mouth daily.   omeprazole DR(+) (PRILOSEC) 20 mg capsule Take one capsule by mouth twice daily.   pravastatin (PRAVACHOL) 20 mg tablet Take one tablet by mouth at bedtime daily.   vitamin E 400 unit capsule Take one capsule by mouth daily.       Review of Systems/Medical History      Patient summary reviewed  Pertinent labs reviewed    PONV Screening: Non-smoker    History of anesthetic complications (hx of post op confusion once after shoulder surgery, resolved quickly)    No family history of anesthetic complications      Airway - negative        Pulmonary             No recent URI        Obstructive Sleep Apnea          Interventions: CPAP; compliant      Cardiovascular         Exercise tolerance: >4 METS      Beta Blocker therapy: No      Beta blockers within 24 hours: n/a      Hypertension, well controlled              No palpitations      No angina      Hyperlipidemia      No orthopnea      No dyspnea on exertion      GI/Hepatic/Renal  GERD, well controlled        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; newly dx [facility]):         No renal disease:           Neuro/Psych         Sensory deficit (HOH, bilateral aids)      Musculoskeletal         Arthritis (knees, shoulders):         Endocrine/Other       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, 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), type 2      Most recent Hgb A1C:< 7.2        No history of blood transfusion        Malignancy (prostate ca s/p 2018 prostatectomy, 2019 salvage radiation; newly dx [facility]):    treated and current      Constitution - negative       Physical Exam    Airway Findings        Neck ROM: full      Mouth opening: good    Dental Findings: Negative      Neurological Findings:       Alert and oriented x 3    Constitutional findings:       No acute distress      Well-developed       Diagnostic Tests  Hematology:   Lab Results   Component Value Date    HGB 12.4 08/03/2016    HCT 38.6 08/03/2016    PLTCT 216 08/03/2016    WBC 10.5 08/03/2016    NEUT 86 08/03/2016    ANC 9.10 08/03/2016    ALC 0.90 08/03/2016    MONA 5 08/03/2016    AMC 0.50 08/03/2016    EOSA 0 08/03/2016    ABC 0.00 08/03/2016    MCV 92.9 08/03/2016    MCH 29.9 08/03/2016    MCHC 32.2 08/03/2016    MPV 7.5 08/03/2016    RDW 14.0 08/03/2016         General Chemistry:   Lab Results   Component Value Date    NA 137 08/03/2016    K 3.7 08/03/2016    CL 103 08/03/2016    CO2 25 08/03/2016    GAP 9 08/03/2016    BUN 27 08/03/2016    CR 0.9 08/18/2020    CR 1.02 08/03/2016    GLU 157 08/03/2016    CA 8.7 08/03/2016    ALBUMIN 4.3 08/03/2016    MG 2.3 08/03/2016    TOTBILI 0.9 08/03/2016      Coagulation: No results found for: PT, PTT, INR        07/19/22 CBC, CMP, INR (outside records):   Na 139  K 4.3  Creat 0.89  INR 1.0  Hgb 12.8  Plt 235          PAC Plan    Interview: Telehealth Interview    ASA Score: 3    Anesthesia Options Discussed: General and Epidural                  DOS Orders:  T&C        PAC RISK ASSESSMENTS:   *Duke Activity Status Index (DASI): 45.45, Calculated METS: 8.33 (score < 25 correlates with increased risk for death, MI, and moderate to severe complications, max score 57)  *Revised Cardiac Risk Index (RCRI): 1 points=0.9% risk for major adverse cardiac events (myocardial infarction, pulmonary edema, cardiac arrest) (high risk surgery)  *Clinical Frailty Score:  3 (score >3 [of 9] increased risk for death, post-op delirium, and discharge to specialty facility)          Alerts

## 2022-08-13 NOTE — Pre-Anesthesia Patient Instructions
PREPROCEDURE INFORMATION    Arrival at the hospital  [facility]  [address]  [place], [place] [address]    Park in the [employer], located directly across from the main entrance to the hospital.  Enter through the ground floor main hospital entrance and check in at the Information Desk in the lobby.  They will validate your parking ticket and direct you to the next location.  If you are a woman between the ages of 27 and 10, and have not had a hysterectomy, you will be asked for a urine sample prior to surgery.  Please do not urinate before arriving in the Surgery Waiting Room.  Once there, check in and let the attendant know if you need to provide a sample.    You will receive a call with your surgery arrival time between 2:30pm and 4:30pm the last business day before your procedure.  If you do not receive a call, please call [phone] before 4:30pm or [phone] after 4:30pm.    Eating or drinking before surgery  Nothing to eat after 11:00pm the night before your surgery including gum, mints and candy.  You may have clear liquids up to 2 hours before your surgery time.  Clear Liquid Examples include:  Water, Clear juice (apple or cranberry (no pulp or orange juice) - If diabetic blood sugar must be <200, Coffee and tea with or without sugar (no cream), Sports drink - Powerade/Gatorade, Soda, and Bowel Prep solutions only if ordered by surgeon.    Planning transportation for outpatient procedure  For your safety, you will need to arrange for a responsible ride/person to accompany you home due to sedation or anesthesia with your procedure.  An [employer], taxi or other public transportation driver is not considered a responsible person to accompany you home.    Bath/Shower Instructions  Take a bath or shower with antibacterial soap the night before and the morning of your procedure. Use clean towels.  Put on clean clothes after bath or shower.  Avoid using lotion and oils.  If you are having surgery above the waist, wear a shirt that fastens up the front.  Sleep on clean sheets if bath or shower is done the night before procedure.    Morning of your procedure:  Brush your teeth and tongue  Do not smoke, vape, chew or user any tobacco products.  Do not shave the area where you will have surgery.  Remove nail polish, makeup and all jewelry (including piercings) before coming to the hospital.  Dress in clean, loose, comfortable clothing.    Valuables  Leave money, credit cards, jewelry, and any other valuables at home. If medications are being filled and picked up at a [place] pharmacy, payment will be needed. The [facility] is not responsible for the loss or breakage of personal items.    What to bring to the hospital  ID/Insurance card  Medical Device card  Official documents for legal guardianship  Copy of your Living Will, Advanced Directives, and/or Durable Power of Attorney. If you have these documents, please bring them to the admissions office on the day of your surgery to be scanned into your records.  Do not bring medications from home unless instructed by a pharmacist.  CPAP/BiPAP machine (including all supplies)  Walker, cane, or motorized scooter  Cases for glasses/hearing aids/contact lens (bring solutions for contacts)  Stimulator remote  Insulin pump and supplies as directed by pharmacy     Notify us  at [facility]: [phone] on the day of your procedure if:  You need to cancel your procedure.  You are going to be late.    Notify your surgeon if:  There is a possibility that you are pregnant.  You become ill with a cough, fever, sore throat, nausea, vomiting or flu-like symptoms.  You have any open wounds/sores that are red, painful, draining, or are new since you last saw the doctor.  You need to cancel your procedure.    Preparing to get your medications at discharge  Your surgeon may prescribe you medications to take after your procedure.  If you like the convenience of having your medications filled here at [place], please do the following:  Go to [place] pharmacy after your [facility] appointment to put a credit card on file.  Call [place] pharmacy at [phone] (Monday-Friday 7am-9pm or Saturday and Sunday 9am-5pm) to put a credit card on file.  Bring a credit card or cash on the day of your procedure- please leave with a family member rather than bringing it into the preop area.    Current Visitor Policy:  Visitors must be free of fever and symptoms to be in our facilities.  No more than 2 visitors per patient are allowed.  Additional guidelines may vary, based on patient care area or patient's condition.  Patients in semiprivate rooms may have visitors, but visits should be coordinated so only two total visitors are in a room at a time due to space limitations.  Children younger than age 69 are allowed to visit inpatients.    Thank you for participating in your Preoperative Assessment visit today.    If you have any changes to your health or hospitalizations between now and your surgery, please call us at [phone].    Instructions given to patient via: verbal and printed copy

## 2022-09-03 ENCOUNTER — Encounter: Admit: 2022-09-03 | Discharge: 2022-09-03 | Payer: MEDICARE

## 2022-09-03 ENCOUNTER — Ambulatory Visit: Admit: 2022-09-03 | Discharge: 2022-09-03 | Payer: MEDICARE

## 2022-09-03 DIAGNOSIS — C61 Malignant neoplasm of prostate: Secondary | ICD-10-CM

## 2022-09-03 LAB — PROSTATIC SPECIFIC ANTIGEN-PSA: PROSTATIC SPEC AG: 0.3 ng/mL (ref <6.01)

## 2022-09-03 NOTE — Telephone Encounter
Discussed he could use an antibacteria soap from the store like dial or he could hibaclens at [facility] or [employer]

## 2022-09-03 NOTE — Telephone Encounter
Pt called needing to speak to nurse about antibacterial soap that he needs to use before surgery ,  please call back

## 2022-09-04 ENCOUNTER — Encounter: Admit: 2022-09-04 | Discharge: 2022-09-04 | Payer: MEDICARE

## 2022-09-04 ENCOUNTER — Inpatient Hospital Stay: Admit: 2022-09-04 | Discharge: 2022-09-04 | Payer: MEDICARE

## 2022-09-04 DIAGNOSIS — N400 Enlarged prostate without lower urinary tract symptoms: Secondary | ICD-10-CM

## 2022-09-04 DIAGNOSIS — G4733 Obstructive sleep apnea (adult) (pediatric): Secondary | ICD-10-CM

## 2022-09-04 DIAGNOSIS — C4491 Basal cell carcinoma of skin, unspecified: Secondary | ICD-10-CM

## 2022-09-04 DIAGNOSIS — I719 Aortic aneurysm of unspecified site, without rupture: Secondary | ICD-10-CM

## 2022-09-04 DIAGNOSIS — I10 Essential (primary) hypertension: Secondary | ICD-10-CM

## 2022-09-04 DIAGNOSIS — K219 Gastro-esophageal reflux disease without esophagitis: Secondary | ICD-10-CM

## 2022-09-04 DIAGNOSIS — N529 Male erectile dysfunction, unspecified: Secondary | ICD-10-CM

## 2022-09-04 DIAGNOSIS — C61 Malignant neoplasm of prostate: Secondary | ICD-10-CM

## 2022-09-04 DIAGNOSIS — R972 Elevated prostate specific antigen [PSA]: Secondary | ICD-10-CM

## 2022-09-04 DIAGNOSIS — E785 Hyperlipidemia, unspecified: Secondary | ICD-10-CM

## 2022-09-04 MED ORDER — FENTANYL CITRATE (PF) 50 MCG/ML IJ SOLN
INTRAVENOUS | 0 refills | Status: CP
Start: 2022-09-04 — End: ?

## 2022-09-04 MED ORDER — DEXMEDETOMIDINE IN 0.9 % NACL 20 MCG/5 ML (4 MCG/ML) IV SYRG
INTRAVENOUS | 0 refills | Status: DC
Start: 2022-09-04 — End: 2022-09-04

## 2022-09-04 MED ORDER — DEXAMETHASONE SODIUM PHOSPHATE 4 MG/ML IJ SOLN
INTRAVENOUS | 0 refills | Status: DC
Start: 2022-09-04 — End: 2022-09-04

## 2022-09-04 MED ORDER — ROCURONIUM 10 MG/ML IV SOLN
INTRAVENOUS | 0 refills | Status: DC
Start: 2022-09-04 — End: 2022-09-04

## 2022-09-04 MED ORDER — PHENYLEPHRINE HCL IN 0.9% NACL 1 MG/10 ML (100 MCG/ML) IV SYRG
INTRAVENOUS | 0 refills | Status: DC
Start: 2022-09-04 — End: 2022-09-04

## 2022-09-04 MED ORDER — ONDANSETRON HCL (PF) 4 MG/2 ML IJ SOLN
INTRAVENOUS | 0 refills | Status: DC
Start: 2022-09-04 — End: 2022-09-04

## 2022-09-04 MED ORDER — PROPOFOL INJ 10 MG/ML IV VIAL
INTRAVENOUS | 0 refills | Status: DC
Start: 2022-09-04 — End: 2022-09-04

## 2022-09-04 MED ORDER — ACETAMINOPHEN 1,000 MG/100 ML (10 MG/ML) IV SOLN
INTRAVENOUS | 0 refills | Status: DC
Start: 2022-09-04 — End: 2022-09-04

## 2022-09-04 MED ORDER — LIDOCAINE (PF) 200 MG/10 ML (2 %) IJ SYRG
INTRAVENOUS | 0 refills | Status: DC
Start: 2022-09-04 — End: 2022-09-04

## 2022-09-04 MED ORDER — PIPERACILLIN/TAZOBACTAM 4.5 G/100ML NS IVPB (MB+)
INTRAVENOUS | 0 refills | Status: DC
Start: 2022-09-04 — End: 2022-09-04
  Administered 2022-09-04 (×2): 4.5 g via INTRAVENOUS

## 2022-09-04 MED ORDER — SUGAMMADEX 100 MG/ML IV SOLN
INTRAVENOUS | 0 refills | Status: DC
Start: 2022-09-04 — End: 2022-09-04

## 2022-09-04 MED ORDER — ALBUMIN, HUMAN 5 % 250 ML IV SOLP (AN)(OSM)
INTRAVENOUS | 0 refills | Status: DC
Start: 2022-09-04 — End: 2022-09-04

## 2022-09-04 MED ORDER — ARTIFICIAL TEARS (PF) SINGLE DOSE DROPS GROUP
OPHTHALMIC | 0 refills | Status: DC
Start: 2022-09-04 — End: 2022-09-04

## 2022-09-04 MED ORDER — HYDROMORPHONE (PF) 2 MG/ML IJ SYRG
INTRAVENOUS | 0 refills | Status: DC
Start: 2022-09-04 — End: 2022-09-04

## 2022-09-04 MED ORDER — BUPIVACAINE HCL 0.25 % (2.5 MG/ML) IJ SOLN
0 refills | Status: DC
Start: 2022-09-04 — End: 2022-09-04

## 2022-09-04 MED ORDER — LIDOCAINE (PF) 10 MG/ML (1 %) IJ SOLN
INTRAMUSCULAR | 0 refills | Status: CP
Start: 2022-09-04 — End: ?

## 2022-09-04 MED ORDER — NOREPINEPHRINE IV DRIP STD CONC (AM)(OR)
INTRAVENOUS | 0 refills | Status: DC
Start: 2022-09-04 — End: 2022-09-04
  Administered 2022-09-04: 17:00:00 .04 ug/kg/min via INTRAVENOUS

## 2022-09-04 MED ORDER — ELECTROLYTE-A IV SOLP
INTRAVENOUS | 0 refills | Status: DC
Start: 2022-09-04 — End: 2022-09-04

## 2022-09-04 MED ORDER — LIDOCAINE-EPINEPHRINE (PF) 1.5 %-1:200,000 IJ SOLN (OR)
0 refills | Status: CP
Start: 2022-09-04 — End: ?

## 2022-09-04 MED ADMIN — LACTATED RINGERS IV SOLP [4318]: 1000.000 mL | INTRAVENOUS | @ 20:00:00 | Stop: 2022-09-06 | NDC 00338011704

## 2022-09-04 MED ADMIN — LACTATED RINGERS IV SOLP [4318]: 1000 mL | INTRAVENOUS | @ 15:00:00 | Stop: 2022-09-04 | NDC 00338011704

## 2022-09-04 MED ADMIN — ACETAMINOPHEN 325 MG PO TAB [101]: 650 mg | ORAL | NDC 00904677361

## 2022-09-04 MED ADMIN — SENNOSIDES-DOCUSATE SODIUM 8.6-50 MG PO TAB [40926]: 1 | ORAL | NDC 00536124810

## 2022-09-04 MED ADMIN — NOREPINEPHRINE BITARTRATE-NACL 4 MG/250 ML (16 MCG/ML) IV SOLN [173409]: 0.04 ug/kg/min | INTRAVENOUS | @ 19:00:00 | Stop: 2022-09-04 | NDC 44567064001

## 2022-09-04 MED ADMIN — POLYETHYLENE GLYCOL 3350 17 GRAM PO PWPK [25424]: 17 g | ORAL | NDC 00904693186

## 2022-09-04 MED ADMIN — BUPIVACAINE 0.0625% 50ML PCA EPIDURAL SYRINGE [211821]: 50.000 mL | EPIDURAL | @ 20:00:00 | NDC 54029248909

## 2022-09-04 MED ADMIN — MAGNESIUM SULFATE IN WATER 4 GRAM/50 ML (8 %) IV PGBK [166563]: 4 g | INTRAVENOUS | Stop: 2022-09-05 | NDC 00264420552

## 2022-09-04 MED ADMIN — OXYCODONE 5 MG PO TAB [10814]: 5 mg | ORAL | @ 21:00:00 | Stop: 2022-09-04 | NDC 00904696661

## 2022-09-05 MED ADMIN — PRAVASTATIN 20 MG PO TAB [11111]: 20 mg | ORAL | @ 02:00:00 | NDC 60505016909

## 2022-09-05 MED ADMIN — ACETAMINOPHEN 500 MG PO TAB [102]: 650 mg | ORAL | @ 04:00:00 | NDC 00904673061

## 2022-09-05 MED ADMIN — POLYETHYLENE GLYCOL 3350 17 GRAM PO PWPK [25424]: 17 g | ORAL | @ 15:00:00 | NDC 00904693186

## 2022-09-05 MED ADMIN — SENNOSIDES-DOCUSATE SODIUM 8.6-50 MG PO TAB [40926]: 1 | ORAL | @ 15:00:00 | Stop: 2022-09-05 | NDC 00536124810

## 2022-09-05 MED ADMIN — BUPIVACAINE 0.0625% 50ML PCA EPIDURAL SYRINGE [211821]: 50.000 mL | EPIDURAL | @ 02:00:00 | NDC 54029248909

## 2022-09-05 MED ADMIN — ACETAMINOPHEN 325 MG PO TAB [101]: 650 mg | ORAL | @ 23:00:00 | NDC 00904677361

## 2022-09-05 MED ADMIN — PANTOPRAZOLE 40 MG PO TBEC [80436]: 40 mg | ORAL | @ 02:00:00 | NDC 65862056099

## 2022-09-05 MED ADMIN — BUPIVACAINE 0.0625% 50ML PCA EPIDURAL SYRINGE [211821]: 50.000 mL | EPIDURAL | @ 23:00:00 | NDC 54029248909

## 2022-09-05 MED ADMIN — ACETAMINOPHEN 325 MG PO TAB [101]: 650 mg | ORAL | @ 18:00:00 | NDC 00904677361

## 2022-09-05 MED ADMIN — ACETAMINOPHEN 325 MG PO TAB [101]: 650 mg | ORAL | @ 09:00:00 | NDC 00904677361

## 2022-09-05 MED ADMIN — BUPIVACAINE 0.0625% 50ML PCA EPIDURAL SYRINGE [211821]: 50.000 mL | EPIDURAL | @ 18:00:00 | NDC 54029248909

## 2022-09-06 ENCOUNTER — Encounter: Admit: 2022-09-06 | Discharge: 2022-09-06 | Payer: MEDICARE

## 2022-09-06 DIAGNOSIS — G4733 Obstructive sleep apnea (adult) (pediatric): Secondary | ICD-10-CM

## 2022-09-06 DIAGNOSIS — R972 Elevated prostate specific antigen [PSA]: Secondary | ICD-10-CM

## 2022-09-06 DIAGNOSIS — C61 Malignant neoplasm of prostate: Secondary | ICD-10-CM

## 2022-09-06 DIAGNOSIS — N400 Enlarged prostate without lower urinary tract symptoms: Secondary | ICD-10-CM

## 2022-09-06 DIAGNOSIS — C4491 Basal cell carcinoma of skin, unspecified: Secondary | ICD-10-CM

## 2022-09-06 DIAGNOSIS — N529 Male erectile dysfunction, unspecified: Secondary | ICD-10-CM

## 2022-09-06 DIAGNOSIS — E785 Hyperlipidemia, unspecified: Secondary | ICD-10-CM

## 2022-09-06 DIAGNOSIS — K219 Gastro-esophageal reflux disease without esophagitis: Secondary | ICD-10-CM

## 2022-09-06 DIAGNOSIS — I10 Essential (primary) hypertension: Secondary | ICD-10-CM

## 2022-09-06 DIAGNOSIS — I719 Aortic aneurysm of unspecified site, without rupture: Secondary | ICD-10-CM

## 2022-09-06 MED ADMIN — POLYETHYLENE GLYCOL 3350 17 GRAM PO PWPK [25424]: 17 g | ORAL | @ 13:00:00 | NDC 00904693186

## 2022-09-06 MED ADMIN — SENNOSIDES-DOCUSATE SODIUM 8.6-50 MG PO TAB [40926]: 2 | ORAL | @ 13:00:00 | NDC 00536124810

## 2022-09-06 MED ADMIN — ACETAMINOPHEN 325 MG PO TAB [101]: 650 mg | ORAL | @ 21:00:00 | NDC 00904677361

## 2022-09-06 MED ADMIN — ACETAMINOPHEN 325 MG PO TAB [101]: 650 mg | ORAL | @ 04:00:00 | NDC 00904677361

## 2022-09-06 MED ADMIN — BISACODYL 10 MG RE SUPP [1080]: 10 mg | RECTAL | @ 13:00:00 | NDC 00574705012

## 2022-09-06 MED ADMIN — BUPIVACAINE 0.0625% 50ML PCA EPIDURAL SYRINGE [211821]: 50.000 mL | EPIDURAL | @ 16:00:00 | NDC 54029248909

## 2022-09-06 MED ADMIN — ACETAMINOPHEN 325 MG PO TAB [101]: 650 mg | ORAL | @ 16:00:00 | NDC 00904677361

## 2022-09-06 MED ADMIN — ACETAMINOPHEN 325 MG PO TAB [101]: 650 mg | ORAL | @ 11:00:00 | NDC 00904677361

## 2022-09-06 MED ADMIN — SODIUM PHOSPHATE 3 MMOL/ML IV SOLN [7351]: 15 mmol | INTRAVENOUS | @ 19:00:00 | Stop: 2022-09-06 | NDC 63323088401

## 2022-09-06 MED ADMIN — DEXTROSE 5% IN WATER IV SOLP [2364]: 15 mmol | INTRAVENOUS | @ 19:00:00 | Stop: 2022-09-06 | NDC 00338001702

## 2022-09-06 MED ADMIN — PRAVASTATIN 20 MG PO TAB [11111]: 20 mg | ORAL | @ 01:00:00 | NDC 60505016909

## 2022-09-06 MED ADMIN — BUPIVACAINE 0.0625% 50ML PCA EPIDURAL SYRINGE [211821]: 50.000 mL | EPIDURAL | @ 21:00:00 | NDC 54029248909

## 2022-09-06 MED ADMIN — BUPIVACAINE 0.0625% 50ML PCA EPIDURAL SYRINGE [211821]: 50.000 mL | EPIDURAL | @ 12:00:00 | NDC 54029248909

## 2022-09-06 MED ADMIN — SENNOSIDES-DOCUSATE SODIUM 8.6-50 MG PO TAB [40926]: 2 | ORAL | @ 01:00:00 | NDC 00536124810

## 2022-09-06 MED ADMIN — BUPIVACAINE 0.0625% 50ML PCA EPIDURAL SYRINGE [211821]: 50.000 mL | EPIDURAL | @ 04:00:00 | NDC 54029248909

## 2022-09-06 MED ADMIN — PANTOPRAZOLE 40 MG PO TBEC [80436]: 40 mg | ORAL | @ 01:00:00 | NDC 65862056099

## 2022-09-06 MED ADMIN — BUPIVACAINE 0.0625% 50ML PCA EPIDURAL SYRINGE [211821]: 50.000 mL | EPIDURAL | @ 08:00:00 | NDC 54029248909

## 2022-09-07 MED ADMIN — BUPIVACAINE 0.0625% 50ML PCA EPIDURAL SYRINGE [211821]: 50.000 mL | EPIDURAL | @ 06:00:00 | Stop: 2022-09-07 | NDC 54029248909

## 2022-09-07 MED ADMIN — ACETAMINOPHEN 325 MG PO TAB [101]: 650 mg | ORAL | @ 03:00:00 | NDC 00904677361

## 2022-09-07 MED ADMIN — DEXTROSE 5% IN WATER IV SOLP [2364]: 15 mmol | INTRAVENOUS | @ 17:00:00 | Stop: 2022-09-07 | NDC 00338001702

## 2022-09-07 MED ADMIN — OXYCODONE 5 MG PO TAB [10814]: 5 mg | ORAL | @ 16:00:00 | NDC 00904696661

## 2022-09-07 MED ADMIN — FENTANYL CITRATE (PF) 50 MCG/ML IJ SOLN [3037]: 25 ug | INTRAVENOUS | @ 22:00:00 | Stop: 2022-09-08 | NDC 00409909412

## 2022-09-07 MED ADMIN — ACETAMINOPHEN 325 MG PO TAB [101]: 650 mg | ORAL | @ 16:00:00 | NDC 00904677361

## 2022-09-07 MED ADMIN — ENOXAPARIN 40 MG/0.4 ML SC SYRG [85052]: 40 mg | SUBCUTANEOUS | @ 01:00:00 | NDC 71288043382

## 2022-09-07 MED ADMIN — PANTOPRAZOLE 40 MG PO TBEC [80436]: 40 mg | ORAL | @ 01:00:00 | NDC 65862056099

## 2022-09-07 MED ADMIN — SODIUM PHOSPHATE 3 MMOL/ML IV SOLN [7351]: 15 mmol | INTRAVENOUS | @ 17:00:00 | Stop: 2022-09-07 | NDC 63323088401

## 2022-09-07 MED ADMIN — OXYCODONE 5 MG PO TAB [10814]: 10 mg | ORAL | @ 20:00:00 | NDC 00904696661

## 2022-09-07 MED ADMIN — ACETAMINOPHEN 325 MG PO TAB [101]: 650 mg | ORAL | @ 22:00:00 | NDC 00904677361

## 2022-09-07 MED ADMIN — BUPIVACAINE 0.0625% 50ML PCA EPIDURAL SYRINGE [211821]: 50.000 mL | EPIDURAL | @ 10:00:00 | Stop: 2022-09-07 | NDC 54029248909

## 2022-09-07 MED ADMIN — BUPIVACAINE 0.0625% 50ML PCA EPIDURAL SYRINGE [211821]: 50.000 mL | EPIDURAL | @ 01:00:00 | NDC 54029248909

## 2022-09-07 MED ADMIN — SENNOSIDES-DOCUSATE SODIUM 8.6-50 MG PO TAB [40926]: 2 | ORAL | @ 14:00:00 | NDC 00536124810

## 2022-09-07 MED ADMIN — SENNOSIDES-DOCUSATE SODIUM 8.6-50 MG PO TAB [40926]: 2 | ORAL | @ 01:00:00 | NDC 00536124810

## 2022-09-07 MED ADMIN — ACETAMINOPHEN 325 MG PO TAB [101]: 650 mg | ORAL | @ 10:00:00 | NDC 00904677361

## 2022-09-07 MED ADMIN — BISACODYL 10 MG RE SUPP [1080]: 10 mg | RECTAL | @ 14:00:00 | NDC 00574705012

## 2022-09-07 MED ADMIN — POLYETHYLENE GLYCOL 3350 17 GRAM PO PWPK [25424]: 17 g | ORAL | @ 14:00:00 | NDC 00904693186

## 2022-09-08 MED ADMIN — ACETAMINOPHEN 325 MG PO TAB [101]: 650 mg | ORAL | @ 23:00:00 | NDC 00904677361

## 2022-09-08 MED ADMIN — OXYCODONE 5 MG PO TAB [10814]: 10 mg | ORAL | @ 16:00:00 | NDC 00904696661

## 2022-09-08 MED ADMIN — OXYCODONE 10 MG PO TAB [166908]: 10 mg | ORAL | @ 12:00:00 | NDC 68084096811

## 2022-09-08 MED ADMIN — ENOXAPARIN 40 MG/0.4 ML SC SYRG [85052]: 40 mg | SUBCUTANEOUS | @ 02:00:00 | NDC 71288043382

## 2022-09-08 MED ADMIN — ACETAMINOPHEN 325 MG PO TAB [101]: 650 mg | ORAL | @ 18:00:00 | NDC 00904677361

## 2022-09-08 MED ADMIN — BISACODYL 10 MG RE SUPP [1080]: 10 mg | RECTAL | @ 15:00:00 | NDC 00574705012

## 2022-09-08 MED ADMIN — ACETAMINOPHEN 325 MG PO TAB [101]: 650 mg | ORAL | @ 12:00:00 | NDC 00904677361

## 2022-09-08 MED ADMIN — OXYCODONE 10 MG PO TAB [166908]: 10 mg | ORAL | NDC 68084096811

## 2022-09-08 MED ADMIN — PANTOPRAZOLE 40 MG PO TBEC [80436]: 40 mg | ORAL | @ 02:00:00 | NDC 00904647461

## 2022-09-08 MED ADMIN — SENNOSIDES-DOCUSATE SODIUM 8.6-50 MG PO TAB [40926]: 2 | ORAL | @ 02:00:00 | NDC 00536124810

## 2022-09-08 MED ADMIN — POLYETHYLENE GLYCOL 3350 17 GRAM PO PWPK [25424]: 17 g | ORAL | @ 15:00:00 | NDC 00904693186

## 2022-09-08 MED ADMIN — OXYCODONE 10 MG PO TAB [166908]: 10 mg | ORAL | @ 21:00:00 | NDC 68084096811

## 2022-09-08 MED ADMIN — METHOCARBAMOL 750 MG PO TAB [4972]: 750 mg | ORAL | @ 15:00:00 | NDC 70010077001

## 2022-09-08 MED ADMIN — ACETAMINOPHEN 325 MG PO TAB [101]: 650 mg | ORAL | @ 03:00:00 | NDC 00904677361

## 2022-09-08 MED ADMIN — METHOCARBAMOL 750 MG PO TAB [4972]: 750 mg | ORAL | @ 03:00:00 | NDC 70010077001

## 2022-09-08 MED ADMIN — OXYCODONE 5 MG PO TAB [10814]: 10 mg | ORAL | @ 07:00:00 | NDC 00904696661

## 2022-09-08 MED ADMIN — SENNOSIDES-DOCUSATE SODIUM 8.6-50 MG PO TAB [40926]: 2 | ORAL | @ 15:00:00 | NDC 00536124810

## 2022-09-09 MED ADMIN — OXYCODONE 5 MG PO TAB [10814]: 10 mg | ORAL | @ 11:00:00 | NDC 00904696661

## 2022-09-09 MED ADMIN — BISACODYL 10 MG RE SUPP [1080]: 10 mg | RECTAL | @ 14:00:00 | NDC 00574705012

## 2022-09-09 MED ADMIN — ACETAMINOPHEN 325 MG PO TAB [101]: 650 mg | ORAL | @ 06:00:00 | NDC 00904677361

## 2022-09-09 MED ADMIN — ENOXAPARIN 40 MG/0.4 ML SC SYRG [85052]: 40 mg | SUBCUTANEOUS | @ 02:00:00 | NDC 71288043382

## 2022-09-09 MED ADMIN — OXYCODONE 10 MG PO TAB [166908]: 10 mg | ORAL | @ 20:00:00 | NDC 68084096811

## 2022-09-09 MED ADMIN — METHOCARBAMOL 750 MG PO TAB [4972]: 750 mg | ORAL | @ 02:00:00 | NDC 70010077001

## 2022-09-09 MED ADMIN — SENNOSIDES-DOCUSATE SODIUM 8.6-50 MG PO TAB [40926]: 2 | ORAL | @ 02:00:00 | NDC 00536124810

## 2022-09-09 MED ADMIN — ACETAMINOPHEN 325 MG PO TAB [101]: 650 mg | ORAL | @ 11:00:00 | NDC 00904677361

## 2022-09-09 MED ADMIN — METHOCARBAMOL 750 MG PO TAB [4972]: 750 mg | ORAL | @ 14:00:00 | NDC 70010077001

## 2022-09-09 MED ADMIN — OXYCODONE 10 MG PO TAB [166908]: 10 mg | ORAL | @ 16:00:00 | NDC 68084096811

## 2022-09-09 MED ADMIN — BISACODYL 10 MG RE SUPP [1080]: 10 mg | RECTAL | @ 02:00:00 | NDC 00574705012

## 2022-09-09 MED ADMIN — PANTOPRAZOLE 40 MG PO TBEC [80436]: 40 mg | ORAL | @ 02:00:00 | NDC 00904647461

## 2022-09-09 MED ADMIN — SENNOSIDES-DOCUSATE SODIUM 8.6-50 MG PO TAB [40926]: 2 | ORAL | @ 14:00:00 | NDC 00536124810

## 2022-09-09 MED ADMIN — OXYCODONE 5 MG PO TAB [10814]: 10 mg | ORAL | @ 06:00:00 | NDC 00904696661

## 2022-09-09 MED ADMIN — OXYCODONE 5 MG PO TAB [10814]: 10 mg | ORAL | @ 02:00:00 | NDC 00904696661

## 2022-09-09 MED ADMIN — ACETAMINOPHEN 325 MG PO TAB [101]: 650 mg | ORAL | @ 18:00:00 | NDC 00904677361

## 2022-09-09 MED ADMIN — POLYETHYLENE GLYCOL 3350 17 GRAM PO PWPK [25424]: 17 g | ORAL | @ 14:00:00 | NDC 00904693186

## 2022-09-10 ENCOUNTER — Inpatient Hospital Stay: Admit: 2022-09-10 | Discharge: 2022-09-10 | Payer: MEDICARE

## 2022-09-10 ENCOUNTER — Encounter: Admit: 2022-09-10 | Discharge: 2022-09-10 | Payer: MEDICARE

## 2022-09-10 ENCOUNTER — Ambulatory Visit: Admit: 2022-09-10 | Discharge: 2022-09-10 | Payer: MEDICARE

## 2022-09-10 DIAGNOSIS — R160 Hepatomegaly, not elsewhere classified: Secondary | ICD-10-CM

## 2022-09-10 DIAGNOSIS — C220 Liver cell carcinoma: Secondary | ICD-10-CM

## 2022-09-10 DIAGNOSIS — Z01812 Encounter for preprocedural laboratory examination: Secondary | ICD-10-CM

## 2022-09-10 MED ORDER — SUCCINYLCHOLINE CHLORIDE 20 MG/ML IJ SOLN
INTRAVENOUS | 0 refills | Status: DC
Start: 2022-09-10 — End: 2022-09-10

## 2022-09-10 MED ORDER — DEXAMETHASONE SODIUM PHOSPHATE 4 MG/ML IJ SOLN
INTRAVENOUS | 0 refills | Status: DC
Start: 2022-09-10 — End: 2022-09-10

## 2022-09-10 MED ORDER — LACTATED RINGERS IV SOLP
1000 mL | INTRAVENOUS | 0 refills
Start: 2022-09-10 — End: ?

## 2022-09-10 MED ORDER — ONDANSETRON HCL (PF) 4 MG/2 ML IJ SOLN
INTRAVENOUS | 0 refills | Status: DC
Start: 2022-09-10 — End: 2022-09-10

## 2022-09-10 MED ORDER — FENTANYL CITRATE (PF) 50 MCG/ML IJ SOLN
INTRAVENOUS | 0 refills | Status: DC
Start: 2022-09-10 — End: 2022-09-10

## 2022-09-10 MED ORDER — PROPOFOL INJ 10 MG/ML IV VIAL
INTRAVENOUS | 0 refills | Status: DC
Start: 2022-09-10 — End: 2022-09-10

## 2022-09-10 MED ORDER — PHENYLEPHRINE HCL IN 0.9% NACL 1 MG/10 ML (100 MCG/ML) IV SYRG
INTRAVENOUS | 0 refills | Status: DC
Start: 2022-09-10 — End: 2022-09-10

## 2022-09-10 MED ORDER — ARTIFICIAL TEARS (PF) SINGLE DOSE DROPS GROUP
OPHTHALMIC | 0 refills | Status: DC
Start: 2022-09-10 — End: 2022-09-10

## 2022-09-10 MED ORDER — LIDOCAINE (PF) 200 MG/10 ML (2 %) IJ SYRG
INTRAVENOUS | 0 refills | Status: DC
Start: 2022-09-10 — End: 2022-09-10

## 2022-09-10 MED ORDER — GLUCAGON HCL 1 MG/ML IJ SOLR
INTRAVENOUS | 0 refills | Status: DC
Start: 2022-09-10 — End: 2022-09-10

## 2022-09-10 MED ADMIN — ACETAMINOPHEN 325 MG PO TAB [101]: 650 mg | ORAL | NDC 00904677361

## 2022-09-10 MED ADMIN — OXYCODONE 5 MG PO TAB [10814]: 10 mg | ORAL | @ 12:00:00 | NDC 00904696661

## 2022-09-10 MED ADMIN — OXYCODONE 10 MG PO TAB [166908]: 10 mg | ORAL | @ 04:00:00 | NDC 68084096811

## 2022-09-10 MED ADMIN — ACETAMINOPHEN 325 MG PO TAB [101]: 650 mg | ORAL | @ 23:00:00 | NDC 00904677361

## 2022-09-10 MED ADMIN — OXYCODONE 10 MG PO TAB [166908]: 10 mg | ORAL | @ 18:00:00 | NDC 68084096811

## 2022-09-10 MED ADMIN — METHOCARBAMOL 750 MG PO TAB [4972]: 750 mg | ORAL | @ 02:00:00 | NDC 70010077001

## 2022-09-10 MED ADMIN — ENOXAPARIN 40 MG/0.4 ML SC SYRG [85052]: 40 mg | SUBCUTANEOUS | @ 02:00:00 | NDC 71288043382

## 2022-09-10 MED ADMIN — OXYCODONE 5 MG PO TAB [10814]: 10 mg | ORAL | NDC 00904696661

## 2022-09-10 MED ADMIN — PANTOPRAZOLE 40 MG PO TBEC [80436]: 40 mg | ORAL | @ 02:00:00 | NDC 00904647461

## 2022-09-10 MED ADMIN — LACTATED RINGERS IV SOLP [4318]: 1000.000 mL | INTRAVENOUS | @ 16:00:00 | Stop: 2022-09-10 | NDC 00338011704

## 2022-09-10 MED ADMIN — MELATONIN 5 MG PO TAB [168576]: 5 mg | ORAL | @ 02:00:00 | NDC 77333052025

## 2022-09-10 MED ADMIN — METHOCARBAMOL 750 MG PO TAB [4972]: 750 mg | ORAL | @ 15:00:00 | NDC 70010077001

## 2022-09-10 MED ADMIN — OXYCODONE 5 MG PO TAB [10814]: 10 mg | ORAL | @ 22:00:00 | NDC 00904696661

## 2022-09-10 MED ADMIN — PIPERACILLIN-TAZOBACTAM 4.5 GRAM IV SOLR [80419]: 4.5 g | INTRAVENOUS | @ 23:00:00 | NDC 60505615900

## 2022-09-10 MED ADMIN — PIPERACILLIN-TAZOBACTAM 4.5 GRAM IV SOLR [80419]: 4.5 g | INTRAVENOUS | @ 18:00:00 | NDC 60505615900

## 2022-09-10 MED ADMIN — SENNOSIDES-DOCUSATE SODIUM 8.6-50 MG PO TAB [40926]: 2 | ORAL | @ 02:00:00 | NDC 00536124810

## 2022-09-10 MED ADMIN — SODIUM CHLORIDE 0.9 % IV PGBK (MB+) [95161]: 4.5 g | INTRAVENOUS | @ 18:00:00 | NDC 00338915930

## 2022-09-10 MED ADMIN — ACETAMINOPHEN 325 MG PO TAB [101]: 650 mg | ORAL | @ 18:00:00 | NDC 00904677361

## 2022-09-10 MED ADMIN — SENNOSIDES-DOCUSATE SODIUM 8.6-50 MG PO TAB [40926]: 2 | ORAL | @ 15:00:00 | NDC 00536124810

## 2022-09-10 MED ADMIN — SODIUM CHLORIDE 0.9 % IV PGBK (MB+) [95161]: 4.5 g | INTRAVENOUS | @ 23:00:00 | NDC 00338915930

## 2022-09-10 MED ADMIN — BISACODYL 10 MG RE SUPP [1080]: 10 mg | RECTAL | @ 15:00:00 | NDC 00574705012

## 2022-09-10 MED ADMIN — ACETAMINOPHEN 325 MG PO TAB [101]: 650 mg | ORAL | @ 12:00:00 | NDC 00904677361

## 2022-09-10 MED ADMIN — SODIUM CHLORIDE 0.9 % IV SOLP [27838]: 250 mL | INTRAVENOUS | @ 15:00:00 | Stop: 2022-09-10 | NDC 00338004902

## 2022-09-10 NOTE — Anesthesia Procedure Notes
Procedure: Airway Placement    AIRWAY INSERTION    Date/Time: 09/10/2022 10:42 AM    Patient location: OR  Urgency: elective  Difficult Airway: No            Airway Procedure  Indication(s) for airway management: surgery        no    Preoxygenated: yes  Patient position: sniffing  Neck stabilization: in-line stabilization    Mask difficulty assessment: 0 - not attempted      Procedure Outcome  Final airway type: endotracheal airway  Endotracheal airway: ETT          ETT size (mm): 7.5  Technique used for successful ETT placement: video laryngoscopy  Devices/methods used in placement: intubating stylet  Insertion site: oral  Blade type: Glide Scope   Laryngoscope/Videolaryngoscope blade size: 3  Cormack-Lehane classification: grade I - full view of glottis      Measured from: gums   Depth: 21 cm  Amount of Air in Cuff: 8 ml  Number of attempts at approach: 1  Placement verified by auscultation and capnometry            Complications  Cardiovascular:   Pulmonary:   Procedure: airway not difficult  Medication:   Additional notes: Atraumatic      Performed by: Bryson Corona, CRNA  Authorized by: Sanda Linger, MD

## 2022-09-10 NOTE — Anesthesia Post-Procedure Evaluation
Post-Anesthesia Evaluation    Name: Kenneth Macdonald      MRN: [id number]     DOB: Nov 04, 1945     Age: 77 y.o.     Sex: male   __________________________________________________________________________     Procedure Information       Anesthesia Start Date/Time: 09/10/22 1040    Procedure: ENDOSCOPIC RETROGRADE CHOLANGIOPANCREATOGRAPHY WITH SPHINCTEROTOMY/ PAPILLOTOMY    Location: ENDO 1 / ENDO/GI    Surgeons: Gordy Savers, MD            Post-Anesthesia Vitals  BP: 142/72 (03/25 1145)  Temp: 36.8 C (98.2 F) (03/25 1135)  Pulse: 77 (03/25 1145)  Respirations: 20 PER MINUTE (03/25 1145)  SpO2: 93 % (03/25 1145)  O2 Device: None (Room air) (03/25 1145)   Vitals Value Taken Time   BP 142/72 09/10/22 1145   Temp 36.8 C (98.2 F) 09/10/22 1135   Pulse 77 09/10/22 1145   Respirations 20 PER MINUTE 09/10/22 1145   SpO2 93 % 09/10/22 1145   O2 Device None (Room air) 09/10/22 1145   ABP     ART BP           Post Anesthesia Evaluation Note    Evaluation location: pre/post  Patient participation: recovered; patient participated in evaluation  Level of consciousness: alert  Pain management: adequate    Hydration: normovolemia  Temperature: 36.0C - 38.4C  Airway patency: adequate    Perioperative Events      Postoperative Status  Cardiovascular status: hemodynamically stable  Respiratory status: spontaneous ventilation  Additional comments: Post-Anesthesia Evaluation Attestation: I reviewed and agree the indicated post-anesthesia care was provided. I have reviewed key portions of the indicated post anesthesia care. I have examined the patient's vitals, physical status, and complications and agree with what is documented.    Staff name:  Sanda Linger, MD Date:  09/10/2022            Perioperative Events  There were no known complications for this encounter.

## 2022-09-10 NOTE — Anesthesia Pre-Procedure Evaluation
[facility] Anesthesia Pre-Procedure Evaluation    Name: Kenneth Macdonald      MRN: [id number]     DOB: 02/02/1946     Age: 77 y.o.     Sex: male   _________________________________________________________________________     Procedure Info:   Procedure Information       Date/Time: 09/10/22 1155    Procedure: ENDOSCOPIC RETROGRADE CHOLANGIOPANCREATOGRAPHY WITH PLACEMENT ENDOSCOPIC STENT INTO BILIARY/ PANCREATIC DUCT - EACH STENT    Location: ENDO 1 / ENDO/GI    Surgeons: Comer Locket, MD            Physical Assessment  Vital Signs (last filed in past 24 hours):  BP: 131/70 (03/25 0606)  Temp: 36.9 ?C (98.4 ?F) (03/25 4540)  Pulse: 76 (03/25 0606)  Respirations: 20 PER MINUTE (03/25 0606)  SpO2: 95 % (03/25 0606)  O2%: 21 % (03/24 2115)  O2 Device: None (Room air) (03/25 0606)      Patient History   Allergies   Allergen Reactions    Ciprofloxacin HIVES and ITCHING    Fish Containing Products SWOLLEN TONGUE    Levofloxacin ITCHING, RASH and HIVES    Rosuvastatin MUSCLE PAIN    Simvastatin MUSCLE PAIN        Current Medications    Medication Directions   acetaminophen (TYLENOL EXTRA STRENGTH) 500 mg tablet Take one tablet by mouth every 6 hours as needed for Pain. Max of 4,000 mg of acetaminophen in 24 hours.   ascorbic acid (vitamin C) 500 mg tablet Take one tablet by mouth daily.   calcium citrate (CALCITRATE) 950 mg tablet Take one tablet by mouth daily.   cholecalciferol (VITAMIN D-3) 1,000 units tablet Take one tablet by mouth daily.   fluticasone propionate (FLONASE) 50 mcg/actuation nasal spray, suspension Apply two sprays to each nostril as directed daily as needed.   lactobacillus rhamnosus GG (CULTURELLE) 10 billion cell capsule Take one capsule by mouth daily.   lisinopril-hydrochlorothiazide (PRINZIDE, ZESTORETIC) 20-12.5 mg tablet Take one tablet by mouth every morning.   loratadine (CLARITIN) 10 mg tablet Take one tablet by mouth every morning.   magnesium oxide (MAGOX) 400 mg (241.3 mg magnesium) tablet Take one tablet by mouth daily.   multivitamin (ONE-A-DAY) tablet Take one tablet by mouth daily.   omeprazole DR(+) (PRILOSEC) 20 mg capsule Take one capsule by mouth twice daily.   pravastatin (PRAVACHOL) 20 mg tablet Take one tablet by mouth at bedtime daily.   vitamin E 400 unit capsule Take one capsule by mouth daily.       Review of Systems/Medical History      Patient summary reviewed  Pertinent labs reviewed    PONV Screening: Non-smoker    History of anesthetic complications (hx of post op confusion once after shoulder surgery, resolved quickly)    No family history of anesthetic complications      Airway - negative        Pulmonary             No recent URI        Obstructive Sleep Apnea          Interventions: CPAP; compliant      Cardiovascular         Exercise tolerance: >4 METS      Beta Blocker therapy: No      Beta blockers within 24 hours: n/a      Hypertension, well controlled  No palpitations      No angina      Hyperlipidemia      No orthopnea      No dyspnea on exertion      GI/Hepatic/Renal             GERD, well controlled        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; newly dx [facility]):         No renal disease:           Neuro/Psych         Sensory deficit (bilateral hearing aids)      Musculoskeletal         Arthritis (knees, shoulders):         Endocrine/Other       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, 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), type 2      Most recent Hgb A1C:< 7.2        No history of blood transfusion        Malignancy (prostate ca s/p 2018 prostatectomy, 2019 salvage radiation; newly dx [facility] s/p hepatic resection 09/04/22 now with concerns for biliary leak):    treated and current      Constitution - negative       Physical Exam    Airway Findings        Neck ROM: full      Mouth opening: good    Dental Findings: Negative      Neurological Findings:       Alert and oriented x 3    Constitutional findings:       No acute distress      Well-developed       Diagnostic Tests  Hematology:   Lab Results Component Value Date    HGB 10.0 09/10/2022    HCT 30.1 09/10/2022    PLTCT 254 09/10/2022    WBC 7.2 09/10/2022    NEUT 86 08/03/2016    ANC 9.10 08/03/2016    ALC 0.90 08/03/2016    MONA 5 08/03/2016    AMC 0.50 08/03/2016    EOSA 0 08/03/2016    ABC 0.00 08/03/2016    MCV 90.8 09/10/2022    MCH 30.1 09/10/2022    MCHC 33.2 09/10/2022    MPV 7.2 09/10/2022    RDW 14.3 09/10/2022         General Chemistry:   Lab Results   Component Value Date    NA 137 09/10/2022    K 4.0 09/10/2022    CL 102 09/10/2022    CO2 25 09/10/2022    GAP 10 09/10/2022    BUN 16 09/10/2022    CR 0.85 09/10/2022    GLU 120 09/10/2022    CA 8.4 09/10/2022    ALBUMIN 3.3 09/10/2022    OBSCA 1.11 09/04/2022    MG 1.9 09/10/2022    TOTBILI 0.5 09/10/2022    PO4 3.0 09/10/2022      Coagulation:   Lab Results   Component Value Date    PT 12.9 09/10/2022    PTT 24.4 09/04/2022    INR 1.2 09/10/2022           07/19/22 CBC, CMP, INR (outside records):   Na 139  K 4.3  Creat 0.89  INR 1.0  Hgb 12.8  Plt 235          PAC Plan    Interview: Telehealth Interview    ASA Score: 3    Anesthesia Options Discussed: General and Epidural  DOS Orders:  T&C        PAC RISK ASSESSMENTS:   *Duke Activity Status Index (DASI): 45.45, Calculated METS: 8.33 (score < 25 correlates with increased risk for death, MI, and moderate to severe complications, max score 57)  *Revised Cardiac Risk Index (RCRI): 1 points=0.9% risk for major adverse cardiac events (myocardial infarction, pulmonary edema, cardiac arrest) (high risk surgery)  *Clinical Frailty Score:  3 (score >3 [of 9] increased risk for death, post-op delirium, and discharge to specialty facility)          Alerts    Anesthesia Plan    ASA score: 3   Plan: general  Induction method: intravenous      Informed Consent  Anesthetic plan and risks discussed with patient.  Use of blood products discussed with patient  Blood Consent: consented

## 2022-09-11 MED ADMIN — SENNOSIDES-DOCUSATE SODIUM 8.6-50 MG PO TAB [40926]: 2 | ORAL | @ 01:00:00 | NDC 00536124810

## 2022-09-11 MED ADMIN — ENOXAPARIN 40 MG/0.4 ML SC SYRG [85052]: 40 mg | SUBCUTANEOUS | @ 01:00:00 | NDC 71288043382

## 2022-09-11 MED ADMIN — METHOCARBAMOL 750 MG PO TAB [4972]: 750 mg | ORAL | @ 01:00:00 | NDC 70010077001

## 2022-09-11 MED ADMIN — SODIUM CHLORIDE 0.9 % IV PGBK (MB+) [95161]: 4.5 g | INTRAVENOUS | @ 12:00:00 | Stop: 2022-09-11 | NDC 00338915930

## 2022-09-11 MED ADMIN — METHOCARBAMOL 750 MG PO TAB [4972]: 750 mg | ORAL | @ 15:00:00 | NDC 70010077001

## 2022-09-11 MED ADMIN — PIPERACILLIN-TAZOBACTAM 4.5 GRAM IV SOLR [80419]: 4.5 g | INTRAVENOUS | @ 06:00:00 | Stop: 2022-09-11 | NDC 60505615900

## 2022-09-11 MED ADMIN — PIPERACILLIN-TAZOBACTAM 4.5 GRAM IV SOLR [80419]: 4.5 g | INTRAVENOUS | @ 12:00:00 | Stop: 2022-09-11 | NDC 60505615900

## 2022-09-11 MED ADMIN — OXYCODONE 5 MG PO TAB [10814]: 10 mg | ORAL | @ 19:00:00 | NDC 00904696661

## 2022-09-11 MED ADMIN — OXYCODONE 5 MG PO TAB [10814]: 10 mg | ORAL | @ 15:00:00 | NDC 00904696661

## 2022-09-11 MED ADMIN — SENNOSIDES-DOCUSATE SODIUM 8.6-50 MG PO TAB [40926]: 2 | ORAL | @ 15:00:00 | NDC 00536124810

## 2022-09-11 MED ADMIN — PIPERACILLIN-TAZOBACTAM 4.5 GRAM IV SOLR [80419]: 4.5 g | INTRAVENOUS | @ 19:00:00 | Stop: 2022-09-11 | NDC 60505615900

## 2022-09-11 MED ADMIN — ACETAMINOPHEN 325 MG PO TAB [101]: 650 mg | ORAL | @ 05:00:00 | NDC 00904677361

## 2022-09-11 MED ADMIN — PANTOPRAZOLE 40 MG PO TBEC [80436]: 40 mg | ORAL | @ 01:00:00 | NDC 00904647461

## 2022-09-11 MED ADMIN — BISACODYL 10 MG RE SUPP [1080]: 10 mg | RECTAL | @ 15:00:00 | NDC 00574705012

## 2022-09-11 MED ADMIN — ACETAMINOPHEN 325 MG PO TAB [101]: 650 mg | ORAL | @ 12:00:00 | NDC 00904677361

## 2022-09-11 MED ADMIN — ACETAMINOPHEN 325 MG PO TAB [101]: 650 mg | ORAL | @ 19:00:00 | NDC 00904677361

## 2022-09-11 MED ADMIN — POLYETHYLENE GLYCOL 3350 17 GRAM PO PWPK [25424]: 17 g | ORAL | @ 15:00:00 | NDC 00904693186

## 2022-09-11 MED ADMIN — SODIUM CHLORIDE 0.9 % IV PGBK (MB+) [95161]: 4.5 g | INTRAVENOUS | @ 19:00:00 | Stop: 2022-09-11 | NDC 00338915930

## 2022-09-11 MED ADMIN — SODIUM CHLORIDE 0.9 % IV PGBK (MB+) [95161]: 4.5 g | INTRAVENOUS | @ 06:00:00 | Stop: 2022-09-11 | NDC 00338915930

## 2022-09-12 ENCOUNTER — Encounter: Admit: 2022-09-12 | Discharge: 2022-09-12 | Payer: MEDICARE

## 2022-09-12 DIAGNOSIS — E785 Hyperlipidemia, unspecified: Secondary | ICD-10-CM

## 2022-09-12 DIAGNOSIS — C4491 Basal cell carcinoma of skin, unspecified: Secondary | ICD-10-CM

## 2022-09-12 DIAGNOSIS — I719 Aortic aneurysm of unspecified site, without rupture: Secondary | ICD-10-CM

## 2022-09-12 DIAGNOSIS — I10 Essential (primary) hypertension: Secondary | ICD-10-CM

## 2022-09-12 DIAGNOSIS — G4733 Obstructive sleep apnea (adult) (pediatric): Secondary | ICD-10-CM

## 2022-09-12 DIAGNOSIS — C61 Malignant neoplasm of prostate: Secondary | ICD-10-CM

## 2022-09-12 DIAGNOSIS — R972 Elevated prostate specific antigen [PSA]: Secondary | ICD-10-CM

## 2022-09-12 DIAGNOSIS — K219 Gastro-esophageal reflux disease without esophagitis: Secondary | ICD-10-CM

## 2022-09-12 DIAGNOSIS — N400 Enlarged prostate without lower urinary tract symptoms: Secondary | ICD-10-CM

## 2022-09-12 DIAGNOSIS — N529 Male erectile dysfunction, unspecified: Secondary | ICD-10-CM

## 2022-09-12 MED ADMIN — METHOCARBAMOL 750 MG PO TAB [4972]: 750 mg | ORAL | @ 03:00:00 | NDC 70010077001

## 2022-09-12 MED ADMIN — ACETAMINOPHEN 325 MG PO TAB [101]: 650 mg | ORAL | @ 06:00:00 | Stop: 2022-09-12 | NDC 00904677361

## 2022-09-12 MED ADMIN — PRAVASTATIN 20 MG PO TAB [11111]: 20 mg | ORAL | @ 03:00:00 | NDC 00904589261

## 2022-09-12 MED ADMIN — SENNOSIDES-DOCUSATE SODIUM 8.6-50 MG PO TAB [40926]: 2 | ORAL | @ 03:00:00 | NDC 00536124810

## 2022-09-12 MED ADMIN — METHOCARBAMOL 750 MG PO TAB [4972]: 750 mg | ORAL | @ 13:00:00 | Stop: 2022-09-12 | NDC 70010077001

## 2022-09-12 MED ADMIN — ACETAMINOPHEN 325 MG PO TAB [101]: 650 mg | ORAL | @ 01:00:00 | NDC 00904677361

## 2022-09-12 MED ADMIN — ENOXAPARIN 40 MG/0.4 ML SC SYRG [85052]: 40 mg | SUBCUTANEOUS | @ 03:00:00 | NDC 71288043382

## 2022-09-12 MED ADMIN — ACETAMINOPHEN 325 MG PO TAB [101]: 650 mg | ORAL | @ 13:00:00 | Stop: 2022-09-12 | NDC 00904677361

## 2022-09-12 MED ADMIN — AMOXICILLIN-POT CLAVULANATE 875-125 MG PO TAB [33228]: 875 mg | ORAL | @ 13:00:00 | Stop: 2022-09-12 | NDC 00093227534

## 2022-09-12 MED ADMIN — OXYCODONE 5 MG PO TAB [10814]: 10 mg | ORAL | @ 01:00:00 | NDC 00904696661

## 2022-09-12 MED ADMIN — SENNOSIDES-DOCUSATE SODIUM 8.6-50 MG PO TAB [40926]: 2 | ORAL | @ 13:00:00 | Stop: 2022-09-12 | NDC 00536124810

## 2022-09-12 MED ADMIN — OXYCODONE 5 MG PO TAB [10814]: 10 mg | ORAL | @ 10:00:00 | Stop: 2022-09-12 | NDC 00904696661

## 2022-09-12 MED ADMIN — BISACODYL 10 MG RE SUPP [1080]: 10 mg | RECTAL | @ 03:00:00 | NDC 00574705012

## 2022-09-12 MED ADMIN — BISACODYL 10 MG RE SUPP [1080]: 10 mg | RECTAL | @ 13:00:00 | Stop: 2022-09-12 | NDC 00574705012

## 2022-09-12 MED ADMIN — POLYETHYLENE GLYCOL 3350 17 GRAM PO PWPK [25424]: 17 g | ORAL | @ 13:00:00 | Stop: 2022-09-12 | NDC 00904693186

## 2022-09-12 MED ADMIN — AMOXICILLIN-POT CLAVULANATE 875-125 MG PO TAB [33228]: 875 mg | ORAL | @ 03:00:00 | Stop: 2022-09-16 | NDC 00093227534

## 2022-09-12 MED ADMIN — PANTOPRAZOLE 40 MG PO TBEC [80436]: 40 mg | ORAL | @ 03:00:00 | NDC 00904647461

## 2022-09-12 MED FILL — METHOCARBAMOL 750 MG PO TAB: 750 mg | ORAL | 5 days supply | Qty: 15 | Fill #1 | Status: CP

## 2022-09-12 MED FILL — AMOXICILLIN-POT CLAVULANATE 875-125 MG PO TAB: 875/125 mg | ORAL | 4 days supply | Qty: 8 | Fill #1 | Status: CP

## 2022-09-12 MED FILL — OXYCODONE 5 MG PO TAB: 5 mg | ORAL | 2 days supply | Qty: 20 | Fill #1 | Status: CP

## 2022-09-18 ENCOUNTER — Encounter: Admit: 2022-09-18 | Discharge: 2022-09-18 | Payer: MEDICARE

## 2022-09-18 MED ORDER — OXYCODONE 5 MG PO TAB
5 mg | ORAL_TABLET | ORAL | 0 refills | Status: CN | PRN
Start: 2022-09-18 — End: ?

## 2022-09-18 MED ORDER — METHOCARBAMOL 750 MG PO TAB
750 mg | ORAL_TABLET | Freq: Three times a day (TID) | ORAL | 1 refills | Status: CN | PRN
Start: 2022-09-18 — End: ?

## 2022-09-18 NOTE — Telephone Encounter
Refill request sent to provider's team.

## 2022-09-18 NOTE — Telephone Encounter
Delores called he is nearly out of his pain medications and methocarpamol. Thet are requesting refills to go to [facility]. Artist Pais said she willsend the refill requests.

## 2022-09-19 ENCOUNTER — Encounter: Admit: 2022-09-19 | Discharge: 2022-09-19 | Payer: MEDICARE

## 2022-09-19 MED ORDER — METHOCARBAMOL 750 MG PO TAB
750 mg | ORAL_TABLET | Freq: Two times a day (BID) | ORAL | 0 refills | Status: AC
Start: 2022-09-19 — End: ?

## 2022-09-20 ENCOUNTER — Encounter: Admit: 2022-09-20 | Discharge: 2022-09-20 | Payer: MEDICARE

## 2022-09-20 ENCOUNTER — Ambulatory Visit: Admit: 2022-09-20 | Discharge: 2022-09-21 | Payer: MEDICARE

## 2022-09-20 VITALS — BP 110/52 | HR 78 | Temp 97.90000°F | Ht 73.0 in | Wt 289.6 lb

## 2022-09-20 DIAGNOSIS — C4491 Basal cell carcinoma of skin, unspecified: Secondary | ICD-10-CM

## 2022-09-20 DIAGNOSIS — C61 Malignant neoplasm of prostate: Secondary | ICD-10-CM

## 2022-09-20 DIAGNOSIS — R972 Elevated prostate specific antigen [PSA]: Secondary | ICD-10-CM

## 2022-09-20 DIAGNOSIS — G4733 Obstructive sleep apnea (adult) (pediatric): Secondary | ICD-10-CM

## 2022-09-20 DIAGNOSIS — N529 Male erectile dysfunction, unspecified: Secondary | ICD-10-CM

## 2022-09-20 DIAGNOSIS — N400 Enlarged prostate without lower urinary tract symptoms: Secondary | ICD-10-CM

## 2022-09-20 DIAGNOSIS — I719 Aortic aneurysm of unspecified site, without rupture: Secondary | ICD-10-CM

## 2022-09-20 DIAGNOSIS — K219 Gastro-esophageal reflux disease without esophagitis: Secondary | ICD-10-CM

## 2022-09-20 DIAGNOSIS — I10 Essential (primary) hypertension: Secondary | ICD-10-CM

## 2022-09-20 DIAGNOSIS — E785 Hyperlipidemia, unspecified: Secondary | ICD-10-CM

## 2022-09-20 DIAGNOSIS — C220 Liver cell carcinoma: Principal | ICD-10-CM

## 2022-09-20 NOTE — Progress Notes
Surgery Postoperative Visit Note    Subjective:      77 year old male with HTN, GERD, HCC s/p R partial hepatectomy (09/04/22) c/b leak from [place] bile ducts s/p ERCP with stent who presents for follow up.    Patient does not have new complaints.  he is tolerating his diet without difficulty.  Bowel function is normal.  The patient is not having any pain.  JP with 10-15 cc of drain output    Objective:     BP 110/52 (BP Source: Arm, Left Upper, Patient Position: Sitting)  - Pulse 78  - Temp 36.6 C (97.9 F) (Core)  - Ht 185.4 cm (6\' 1" )  - Wt 131.4 kg (289 lb 9.6 oz)  - SpO2 94%  - BMI 38.21 kg/m     General:  alert, well-developed, and well-nourished   Abdomen: soft, bowel sounds active, non-tender   Incision:   healing well, no drainage, no erythema, no hernia, no seroma, no swelling, no dehiscence, incision well approximated  JP with dark output     Pathology  Final Diagnosis:     A. Gallbladder, "gallbladder", cholecystectomy:   Chronic cholecystitis.     B. Liver, segment 5-6 of liver, excision:    Well-differentiated hepatocellular carcinoma.   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     Assessment:     77 year old male with HTN, GERD, HCC s/p R partial hepatectomy (09/04/22) c/b leak from [place] bile ducts s/p ERCP with stent who presents for follow up.    Doing well postoperatively.    Plan:     JP removed today  Discussed pathology  Will place medical oncology referral for surveillance imaging  RTC prn    Babak Mohammadian, DO    I personally performed the key portions of the E/M visit, discussed case with the resident team and concur with the documentation of the history, physical exam, assessment, and treatment plan.  I have edited this note where appropriate.    Jimmy Footman, MD PhD  Transplant Surgery  Department of Surgery

## 2022-09-24 ENCOUNTER — Encounter: Admit: 2022-09-24 | Discharge: 2022-09-24 | Payer: MEDICARE

## 2022-10-05 ENCOUNTER — Encounter: Admit: 2022-10-05 | Discharge: 2022-10-05 | Payer: MEDICARE

## 2022-10-09 ENCOUNTER — Encounter: Admit: 2022-10-09 | Discharge: 2022-10-09 | Payer: MEDICARE

## 2022-10-09 DIAGNOSIS — G4733 Obstructive sleep apnea (adult) (pediatric): Secondary | ICD-10-CM

## 2022-10-09 DIAGNOSIS — C4491 Basal cell carcinoma of skin, unspecified: Secondary | ICD-10-CM

## 2022-10-09 DIAGNOSIS — N529 Male erectile dysfunction, unspecified: Secondary | ICD-10-CM

## 2022-10-09 DIAGNOSIS — I10 Essential (primary) hypertension: Secondary | ICD-10-CM

## 2022-10-09 DIAGNOSIS — E785 Hyperlipidemia, unspecified: Secondary | ICD-10-CM

## 2022-10-09 DIAGNOSIS — K219 Gastro-esophageal reflux disease without esophagitis: Secondary | ICD-10-CM

## 2022-10-09 DIAGNOSIS — R972 Elevated prostate specific antigen [PSA]: Secondary | ICD-10-CM

## 2022-10-09 DIAGNOSIS — C220 Liver cell carcinoma: Secondary | ICD-10-CM

## 2022-10-09 DIAGNOSIS — I719 Aortic aneurysm of unspecified site, without rupture: Secondary | ICD-10-CM

## 2022-10-09 DIAGNOSIS — N400 Enlarged prostate without lower urinary tract symptoms: Secondary | ICD-10-CM

## 2022-10-09 DIAGNOSIS — C61 Malignant neoplasm of prostate: Secondary | ICD-10-CM

## 2022-10-09 NOTE — Telephone Encounter
Called patient back regarding his recent visit- stated that he would like to continue care with Dr. Tamsen Meek at [facility] and does not want to go to [place] for care.  Will let team know and let patient know that we would follow up with appointments.  He was agreeable.

## 2022-10-11 ENCOUNTER — Encounter: Admit: 2022-10-11 | Discharge: 2022-10-11 | Payer: MEDICARE

## 2022-10-11 DIAGNOSIS — C220 Liver cell carcinoma: Secondary | ICD-10-CM

## 2022-10-15 ENCOUNTER — Encounter: Admit: 2022-10-15 | Discharge: 2022-10-15 | Payer: MEDICARE

## 2022-10-15 DIAGNOSIS — I10 Essential (primary) hypertension: Secondary | ICD-10-CM

## 2022-10-15 DIAGNOSIS — K219 Gastro-esophageal reflux disease without esophagitis: Secondary | ICD-10-CM

## 2022-10-15 DIAGNOSIS — I719 Aortic aneurysm of unspecified site, without rupture: Secondary | ICD-10-CM

## 2022-10-15 DIAGNOSIS — N529 Male erectile dysfunction, unspecified: Secondary | ICD-10-CM

## 2022-10-15 DIAGNOSIS — E785 Hyperlipidemia, unspecified: Secondary | ICD-10-CM

## 2022-10-15 DIAGNOSIS — G4733 Obstructive sleep apnea (adult) (pediatric): Secondary | ICD-10-CM

## 2022-10-15 DIAGNOSIS — C4491 Basal cell carcinoma of skin, unspecified: Secondary | ICD-10-CM

## 2022-10-15 DIAGNOSIS — R972 Elevated prostate specific antigen [PSA]: Secondary | ICD-10-CM

## 2022-10-15 DIAGNOSIS — N400 Enlarged prostate without lower urinary tract symptoms: Secondary | ICD-10-CM

## 2022-10-15 DIAGNOSIS — C61 Malignant neoplasm of prostate: Secondary | ICD-10-CM

## 2022-11-02 ENCOUNTER — Encounter: Admit: 2022-11-02 | Discharge: 2022-11-02 | Payer: MEDICARE

## 2022-11-02 ENCOUNTER — Ambulatory Visit: Admit: 2022-11-02 | Discharge: 2022-11-02 | Payer: MEDICARE

## 2022-11-02 DIAGNOSIS — C220 Liver cell carcinoma: Secondary | ICD-10-CM

## 2022-11-02 LAB — PROTIME INR (PT)
INR: 1 mg/dL (ref 0.9–1.2)
PROTIME: 11 s (ref 10.2–12.9)

## 2022-11-02 LAB — CBC AND DIFF
ABSOLUTE BASO COUNT: 0 K/UL (ref 0–0.20)
ABSOLUTE EOS COUNT: 0.1 K/UL (ref 0–0.45)
ABSOLUTE LYMPH COUNT: 1.3 K/UL (ref 1.0–4.8)
ABSOLUTE MONO COUNT: 0.8 K/UL (ref 0–0.80)
ABSOLUTE NEUTROPHIL: 2.4 K/UL (ref 1.8–7.0)
BASOPHILS %: 1 % (ref 0–2)
EOSINOPHILS %: 4 % (ref >60)
HEMATOCRIT: 33 % — ABNORMAL LOW (ref 40–50)
HEMOGLOBIN: 10 g/dL — ABNORMAL LOW (ref 13.5–16.5)
LYMPHOCYTES %: 29 % (ref 24–44)
MCHC: 32 g/dL (ref 32.0–36.0)
MCV: 86 FL (ref 80–100)
MONOCYTES %: 17 % — ABNORMAL HIGH (ref 4–12)
MPV: 7.4 FL (ref 7–11)
NEUTROPHILS %: 49 % (ref 41–77)
PLATELET COUNT: 279 K/UL (ref 150–400)
RBC COUNT: 3.8 M/UL — ABNORMAL LOW (ref 4.4–5.5)
RDW: 16 % — ABNORMAL HIGH (ref 11–15)
WBC COUNT: 4.8 K/UL (ref 4.5–11.0)

## 2022-11-02 LAB — COMPREHENSIVE METABOLIC PANEL
POTASSIUM: 4 MMOL/L (ref 3.5–5.1)
SODIUM: 138 MMOL/L (ref >60)

## 2022-11-02 LAB — ALPHA FETO PROTEIN (AFP), NON-PREGNANT: AFP: 1.9 ng/mL (ref 0.0–15.0)

## 2022-11-04 ENCOUNTER — Encounter: Admit: 2022-11-04 | Discharge: 2022-11-04 | Payer: MEDICARE

## 2022-11-05 ENCOUNTER — Ambulatory Visit: Admit: 2022-11-05 | Discharge: 2022-11-05 | Payer: MEDICARE

## 2022-11-05 ENCOUNTER — Encounter: Admit: 2022-11-05 | Discharge: 2022-11-05 | Payer: MEDICARE

## 2022-11-05 DIAGNOSIS — I10 Essential (primary) hypertension: Secondary | ICD-10-CM

## 2022-11-05 DIAGNOSIS — R972 Elevated prostate specific antigen [PSA]: Secondary | ICD-10-CM

## 2022-11-05 DIAGNOSIS — C61 Malignant neoplasm of prostate: Secondary | ICD-10-CM

## 2022-11-05 DIAGNOSIS — K219 Gastro-esophageal reflux disease without esophagitis: Secondary | ICD-10-CM

## 2022-11-05 DIAGNOSIS — N400 Enlarged prostate without lower urinary tract symptoms: Secondary | ICD-10-CM

## 2022-11-05 DIAGNOSIS — G4733 Obstructive sleep apnea (adult) (pediatric): Secondary | ICD-10-CM

## 2022-11-05 DIAGNOSIS — C4491 Basal cell carcinoma of skin, unspecified: Secondary | ICD-10-CM

## 2022-11-05 DIAGNOSIS — E785 Hyperlipidemia, unspecified: Secondary | ICD-10-CM

## 2022-11-05 DIAGNOSIS — I719 Aortic aneurysm of unspecified site, without rupture: Secondary | ICD-10-CM

## 2022-11-05 DIAGNOSIS — N529 Male erectile dysfunction, unspecified: Secondary | ICD-10-CM

## 2022-11-05 MED ORDER — PHENYLEPHRINE HCL IN 0.9% NACL 1 MG/10 ML (100 MCG/ML) IV SYRG
INTRAVENOUS | 0 refills | Status: DC
Start: 2022-11-05 — End: 2022-11-05

## 2022-11-05 MED ORDER — LIDOCAINE (PF) 200 MG/10 ML (2 %) IJ SYRG
INTRAVENOUS | 0 refills | Status: DC
Start: 2022-11-05 — End: 2022-11-05

## 2022-11-05 MED ORDER — FENTANYL CITRATE (PF) 50 MCG/ML IJ SOLN
INTRAVENOUS | 0 refills | Status: DC
Start: 2022-11-05 — End: 2022-11-05

## 2022-11-05 MED ORDER — ONDANSETRON HCL (PF) 4 MG/2 ML IJ SOLN
INTRAVENOUS | 0 refills | Status: DC
Start: 2022-11-05 — End: 2022-11-05

## 2022-11-05 MED ORDER — DEXAMETHASONE SODIUM PHOSPHATE 4 MG/ML IJ SOLN
INTRAVENOUS | 0 refills | Status: DC
Start: 2022-11-05 — End: 2022-11-05

## 2022-11-05 MED ORDER — ARTIFICIAL TEARS (PF) SINGLE DOSE DROPS GROUP
OPHTHALMIC | 0 refills | Status: DC
Start: 2022-11-05 — End: 2022-11-05

## 2022-11-05 MED ORDER — SUCCINYLCHOLINE CHLORIDE 20 MG/ML IJ SOLN
INTRAVENOUS | 0 refills | Status: DC
Start: 2022-11-05 — End: 2022-11-05

## 2022-11-05 MED ORDER — PROPOFOL INJ 10 MG/ML IV VIAL
INTRAVENOUS | 0 refills | Status: DC
Start: 2022-11-05 — End: 2022-11-05

## 2022-11-05 MED ADMIN — LACTATED RINGERS IV SOLP [4318]: 1000.000 mL | INTRAVENOUS | @ 20:00:00 | Stop: 2022-11-05 | NDC 00338011704

## 2022-11-05 NOTE — Anesthesia Pre-Procedure Evaluation
Anesthesia Pre-Procedure Evaluation    Name: Kenneth Macdonald      MRN: [id number]     DOB: [birth year]     Age: 77 y.o.     Sex: male   _________________________________________________________________________     Procedure Info:   Procedure Information       Date/Time: 11/05/22 1200    Procedure: ENDOSCOPIC RETROGRADE CHOLANGIOPANCREATOGRAPHY    Location: ENDO 1 / ENDO/GI    Surgeons: Comer Locket, MD            Physical Assessment  Vital Signs (last filed in past 24 hours):         Patient History   Allergies   Allergen Reactions    Ciprofloxacin HIVES and ITCHING    Fish Containing Products SWOLLEN TONGUE    Levofloxacin ITCHING, RASH and HIVES    Rosuvastatin MUSCLE PAIN    Simvastatin MUSCLE PAIN        Current Medications    Medication Directions   acetaminophen (TYLENOL EXTRA STRENGTH) 500 mg tablet Take one tablet by mouth every 6 hours as needed for Pain. Max of 4,000 mg of acetaminophen in 24 hours.   ascorbic acid (vitamin C) 500 mg tablet Take one tablet by mouth daily.   calcium citrate (CALCITRATE) 950 mg tablet Take one tablet by mouth daily.   cholecalciferol (VITAMIN D-3) 1,000 units tablet Take one tablet by mouth daily.   fluticasone propionate (FLONASE) 50 mcg/actuation nasal spray, suspension Apply two sprays to each nostril as directed daily as needed.   lactobacillus rhamnosus GG (CULTURELLE) 10 billion cell capsule Take one capsule by mouth daily.   lisinopril-hydrochlorothiazide (PRINZIDE, ZESTORETIC) 20-12.5 mg tablet Take one tablet by mouth every morning.   loratadine (CLARITIN) 10 mg tablet Take one tablet by mouth every morning.   magnesium oxide (MAGOX) 400 mg (241.3 mg magnesium) tablet Take one tablet by mouth daily.   methocarbamoL (ROBAXIN) 750 mg tablet Take one tablet by mouth twice daily. Indications: muscle spasm   multivitamin (ONE-A-DAY) tablet Take one tablet by mouth daily.   omeprazole DR(+) (PRILOSEC) 20 mg capsule Take one capsule by mouth twice daily.   oxyCODONE (ROXICODONE) 5 mg tablet Take one tablet to two tablets by mouth every 4 hours as needed. Indications: pain   polyethylene glycol 3350 (MIRALAX) 17 gram/dose powder Take seventeen grams by mouth daily. Indications: constipation   pravastatin (PRAVACHOL) 20 mg tablet Take one tablet by mouth at bedtime daily.   sennosides-docusate sodium (SENOKOT-S) 8.6/50 mg tablet Take two tablets by mouth twice daily. Indications: constipation   vitamin E 400 unit capsule Take one capsule by mouth daily.       Review of Systems/Medical History      Patient summary reviewed  Pertinent labs reviewed    PONV Screening: Non-smoker    History of anesthetic complications (hx of post op confusion once after shoulder surgery, resolved quickly)    No family history of anesthetic complications      Airway - negative        Pulmonary             No recent URI        Obstructive Sleep Apnea          Interventions: CPAP; compliant      Cardiovascular         Exercise tolerance: >4 METS      Beta Blocker therapy: No      Beta blockers within 24 hours: n/a  Hypertension, well controlled              No palpitations      No angina      Hyperlipidemia      No orthopnea      No dyspnea on exertion      GI/Hepatic/Renal             GERD, well controlled        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; newly dx [facility]):         No renal disease:           Neuro/Psych         Sensory deficit (bilateral hearing aids)      Musculoskeletal         Arthritis (knees, shoulders):         Endocrine/Other       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, 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), type 2      Most recent Hgb A1C:< 7.2        No history of blood transfusion        Malignancy (prostate ca s/p 2018 prostatectomy, 2019 salvage radiation; newly dx [facility] s/p hepatic resection 09/04/22 now with concerns for biliary leak):    treated and current      Constitution - negative       Physical Exam    Airway Findings        Neck ROM: full      Mouth opening: good    Dental Findings: Negative      Neurological Findings:       Alert and oriented x 3    Constitutional findings:       No acute distress      Well-developed       Diagnostic Tests  Hematology:   Lab Results   Component Value Date    HGB 10.8 11/02/2022    HCT 33.4 11/02/2022    PLTCT 279 11/02/2022    WBC 4.8 11/02/2022    NEUT 49 11/02/2022    ANC 2.41 11/02/2022    ALC 1.39 11/02/2022    MONA 17 11/02/2022    AMC 0.80 11/02/2022    EOSA 4 11/02/2022    ABC 0.03 11/02/2022    MCV 86.9 11/02/2022    MCH 28.0 11/02/2022    MCHC 32.3 11/02/2022    MPV 7.4 11/02/2022    RDW 16.9 11/02/2022         General Chemistry:   Lab Results   Component Value Date    NA 138 11/02/2022    K 4.0 11/02/2022    CL 104 11/02/2022    CO2 25 11/02/2022    GAP 9 11/02/2022    BUN 21 11/02/2022    CR 0.99 11/02/2022    GLU 82 11/02/2022    CA 8.9 11/02/2022    ALBUMIN 4.0 11/02/2022    OBSCA 1.11 09/04/2022    MG 2.0 09/11/2022    TOTBILI 0.4 11/02/2022    PO4 3.0 09/11/2022      Coagulation:   Lab Results   Component Value Date    PT 11.4 11/02/2022    PTT 24.4 09/04/2022    INR 1.0 11/02/2022           07/19/22 CBC, CMP, INR (outside records):   Na 139  K 4.3  Creat 0.89  INR 1.0  Hgb 12.8  Plt 235          PAC Plan    Interview: Telehealth  Interview    ASA Score: 3    Anesthesia Options Discussed: General and Epidural                  DOS Orders:  T&C        PAC RISK ASSESSMENTS:   *Duke Activity Status Index (DASI): 45.45, Calculated METS: 8.33 (score < 25 correlates with increased risk for death, MI, and moderate to severe complications, max score 57)  *Revised Cardiac Risk Index (RCRI): 1 points=0.9% risk for major adverse cardiac events (myocardial infarction, pulmonary edema, cardiac arrest) (high risk surgery)  *Clinical Frailty Score:  3 (score >3 [of 9] increased risk for death, post-op delirium, and discharge to specialty facility)          Alerts    Anesthesia Plan    ASA score: 3   Plan: general  Induction method: intravenous      Informed Consent  Anesthetic plan and risks discussed with patient.  Use of blood products discussed with patient  Blood Consent: consented      Plan discussed with: anesthesiologist and CRNA.

## 2022-11-06 ENCOUNTER — Ambulatory Visit: Admit: 2022-11-06 | Discharge: 2022-11-06 | Payer: MEDICARE

## 2022-11-06 ENCOUNTER — Encounter: Admit: 2022-11-06 | Discharge: 2022-11-06 | Payer: MEDICARE

## 2022-11-06 DIAGNOSIS — C61 Malignant neoplasm of prostate: Secondary | ICD-10-CM

## 2022-11-07 ENCOUNTER — Encounter: Admit: 2022-11-07 | Discharge: 2022-11-07 | Payer: MEDICARE

## 2022-11-07 DIAGNOSIS — I10 Essential (primary) hypertension: Secondary | ICD-10-CM

## 2022-11-07 DIAGNOSIS — C4491 Basal cell carcinoma of skin, unspecified: Secondary | ICD-10-CM

## 2022-11-07 DIAGNOSIS — N400 Enlarged prostate without lower urinary tract symptoms: Secondary | ICD-10-CM

## 2022-11-07 DIAGNOSIS — C61 Malignant neoplasm of prostate: Secondary | ICD-10-CM

## 2022-11-07 DIAGNOSIS — K219 Gastro-esophageal reflux disease without esophagitis: Secondary | ICD-10-CM

## 2022-11-07 DIAGNOSIS — G4733 Obstructive sleep apnea (adult) (pediatric): Secondary | ICD-10-CM

## 2022-11-07 DIAGNOSIS — E785 Hyperlipidemia, unspecified: Secondary | ICD-10-CM

## 2022-11-07 DIAGNOSIS — I719 Aortic aneurysm of unspecified site, without rupture: Secondary | ICD-10-CM

## 2022-11-07 DIAGNOSIS — N529 Male erectile dysfunction, unspecified: Secondary | ICD-10-CM

## 2022-11-07 DIAGNOSIS — R972 Elevated prostate specific antigen [PSA]: Secondary | ICD-10-CM

## 2022-11-14 ENCOUNTER — Encounter: Admit: 2022-11-14 | Discharge: 2022-11-14 | Payer: MEDICARE

## 2022-11-14 DIAGNOSIS — C61 Malignant neoplasm of prostate: Secondary | ICD-10-CM

## 2022-11-14 NOTE — Telephone Encounter
Spoke with patient to return his call re: appt next week. Discussed with Dr. Jimmey Ralph and plan to move visit out about 3 mo, if patient is doing well. Patient denies any urinary issues or concerns, at this time. Agreeable to push appointment out and will set it up as telehealth. Will mail lab order so he can get his lab locally, if he doesn't end up in the [facility] area in the few weeks prior to his appointment.

## 2022-12-31 ENCOUNTER — Encounter: Admit: 2022-12-31 | Discharge: 2022-12-31 | Payer: MEDICARE

## 2022-12-31 DIAGNOSIS — C61 Malignant neoplasm of prostate: Secondary | ICD-10-CM

## 2022-12-31 NOTE — Telephone Encounter
Patient requesting to have PSA order entered and change visit on 8/2 with Dr. Flonnie Hailstone to [facility].    Called patient and entered order for PSA which he will have drawn at [facility].  Also changed visit to [facility].    Patient v/u and appreciation

## 2023-01-09 ENCOUNTER — Encounter: Admit: 2023-01-09 | Discharge: 2023-01-09 | Payer: MEDICARE

## 2023-01-09 DIAGNOSIS — C220 Liver cell carcinoma: Secondary | ICD-10-CM

## 2023-01-11 ENCOUNTER — Ambulatory Visit: Admit: 2023-01-11 | Discharge: 2023-01-11 | Payer: MEDICARE

## 2023-01-11 ENCOUNTER — Encounter: Admit: 2023-01-11 | Discharge: 2023-01-11 | Payer: MEDICARE

## 2023-01-11 DIAGNOSIS — C220 Liver cell carcinoma: Secondary | ICD-10-CM

## 2023-01-11 DIAGNOSIS — C61 Malignant neoplasm of prostate: Secondary | ICD-10-CM

## 2023-01-11 LAB — COMPREHENSIVE METABOLIC PANEL
ALBUMIN: 4.3 g/dL (ref 3.5–5.0)
ALK PHOSPHATASE: 55 U/L (ref 25–110)
ALT: 18 U/L (ref 7–56)
ANION GAP: 9 10*3/uL (ref 3–12)
AST: 25 U/L (ref 7–40)
CO2: 27 MMOL/L (ref 21–30)
EGFR: >60 mL/min (ref >60)
GLUCOSE,PANEL: 111 mg/dL — ABNORMAL HIGH (ref 70–100)
POTASSIUM: 4.6 MMOL/L — ABNORMAL LOW (ref 3.5–5.1)
SODIUM: 138 MMOL/L — ABNORMAL LOW (ref 137–147)
TOTAL BILIRUBIN: 0.5 mg/dL (ref 0.2–1.3)
TOTAL PROTEIN: 7.8 g/dL (ref 6.0–8.0)

## 2023-01-11 LAB — CBC AND DIFF
ABSOLUTE BASO COUNT: 0 10*3/uL (ref 0–0.20)
ABSOLUTE EOS COUNT: 0.1 10*3/uL (ref 0–0.45)
RBC COUNT: 4.3 M/UL — ABNORMAL LOW (ref 4.4–5.5)
WBC COUNT: 4.8 10*3/uL (ref 4.5–11.0)

## 2023-01-11 LAB — PROSTATIC SPECIFIC ANTIGEN-PSA: PROSTATIC SPEC AG: 0.4 ng/mL (ref <6.01)

## 2023-01-11 LAB — ALPHA FETO PROTEIN (AFP), NON-PREGNANT: AFP: 1.7 ng/mL (ref 0.0–15.0)

## 2023-01-11 LAB — PROTIME INR (PT)
INR: 1.1 mg/dL — ABNORMAL HIGH (ref 0.9–1.2)
PROTIME: 11 s (ref 10.2–12.9)

## 2023-01-15 ENCOUNTER — Encounter: Admit: 2023-01-15 | Discharge: 2023-01-15 | Payer: MEDICARE

## 2023-01-15 DIAGNOSIS — C220 Liver cell carcinoma: Secondary | ICD-10-CM

## 2023-01-18 ENCOUNTER — Ambulatory Visit: Admit: 2023-01-18 | Discharge: 2023-01-18 | Payer: MEDICARE

## 2023-01-18 ENCOUNTER — Encounter: Admit: 2023-01-18 | Discharge: 2023-01-18 | Payer: MEDICARE

## 2023-01-18 DIAGNOSIS — C61 Malignant neoplasm of prostate: Secondary | ICD-10-CM

## 2023-01-21 ENCOUNTER — Encounter: Admit: 2023-01-21 | Discharge: 2023-01-21 | Payer: MEDICARE

## 2023-01-21 DIAGNOSIS — C220 Liver cell carcinoma: Secondary | ICD-10-CM

## 2023-01-21 LAB — CBC AND DIFF
BASOPHILS %: 1 % (ref >60)
EOSINOPHILS %: 3 % (ref 0–5)
HEMATOCRIT: 37 % — ABNORMAL LOW (ref 40–50)
HEMOGLOBIN: 12 g/dL — ABNORMAL LOW (ref 13.5–16.5)
LYMPHOCYTES %: 27 % (ref 24–44)
MCH: 28 pg (ref 26–34)
MCHC: 32 g/dL (ref 32.0–36.0)
MCV: 87 FL (ref 80–100)
MONOCYTES %: 9 % (ref 4–12)
MPV: 7.6 FL (ref 7–11)
NEUTROPHILS %: 60 % (ref 41–77)
PLATELET COUNT: 244 10*3/uL (ref 150–400)
RBC COUNT: 4.3 M/UL — ABNORMAL LOW (ref 4.4–5.5)
RDW: 18 % — ABNORMAL HIGH (ref 11–15)
WBC COUNT: 5 10*3/uL (ref 4.5–11.0)

## 2023-01-21 LAB — ALPHA FETO PROTEIN (AFP), NON-PREGNANT: AFP: 2 ng/mL (ref 0.0–15.0)

## 2023-01-21 LAB — PROTIME INR (PT): PROTIME: 11 s (ref 10.2–12.9)

## 2023-01-21 LAB — COMPREHENSIVE METABOLIC PANEL: SODIUM: 136 MMOL/L — ABNORMAL LOW (ref 137–147)

## 2023-01-21 MED ORDER — SODIUM CHLORIDE 0.9 % IJ SOLN
50 mL | Freq: Once | INTRAVENOUS | 0 refills | Status: CP
Start: 2023-01-21 — End: ?
  Administered 2023-01-21: 19:00:00 50 mL via INTRAVENOUS

## 2023-01-21 MED ORDER — IOHEXOL 350 MG IODINE/ML IV SOLN
100 mL | Freq: Once | INTRAVENOUS | 0 refills | Status: CP
Start: 2023-01-21 — End: ?
  Administered 2023-01-21: 19:00:00 100 mL via INTRAVENOUS

## 2023-01-22 ENCOUNTER — Encounter: Admit: 2023-01-22 | Discharge: 2023-01-22 | Payer: MEDICARE

## 2023-01-22 DIAGNOSIS — C220 Liver cell carcinoma: Secondary | ICD-10-CM

## 2023-01-24 ENCOUNTER — Encounter: Admit: 2023-01-24 | Discharge: 2023-01-24 | Payer: MEDICARE

## 2023-01-24 DIAGNOSIS — R972 Elevated prostate specific antigen [PSA]: Secondary | ICD-10-CM

## 2023-01-24 DIAGNOSIS — C4491 Basal cell carcinoma of skin, unspecified: Secondary | ICD-10-CM

## 2023-01-24 DIAGNOSIS — N529 Male erectile dysfunction, unspecified: Secondary | ICD-10-CM

## 2023-01-24 DIAGNOSIS — E785 Hyperlipidemia, unspecified: Secondary | ICD-10-CM

## 2023-01-24 DIAGNOSIS — C61 Malignant neoplasm of prostate: Secondary | ICD-10-CM

## 2023-01-24 DIAGNOSIS — G4733 Obstructive sleep apnea (adult) (pediatric): Secondary | ICD-10-CM

## 2023-01-24 DIAGNOSIS — K219 Gastro-esophageal reflux disease without esophagitis: Secondary | ICD-10-CM

## 2023-01-24 DIAGNOSIS — I10 Essential (primary) hypertension: Secondary | ICD-10-CM

## 2023-01-24 DIAGNOSIS — N400 Enlarged prostate without lower urinary tract symptoms: Secondary | ICD-10-CM

## 2023-01-24 DIAGNOSIS — I719 Aortic aneurysm of unspecified site, without rupture: Secondary | ICD-10-CM

## 2023-02-13 ENCOUNTER — Ambulatory Visit: Admit: 2023-02-13 | Discharge: 2023-02-13 | Payer: MEDICARE

## 2023-02-13 ENCOUNTER — Encounter: Admit: 2023-02-13 | Discharge: 2023-02-13 | Payer: MEDICARE

## 2023-02-13 DIAGNOSIS — C61 Malignant neoplasm of prostate: Secondary | ICD-10-CM

## 2023-02-13 LAB — PROSTATIC SPECIFIC ANTIGEN-PSA: PROSTATIC SPEC AG: 0.4 ng/mL (ref <6.01)

## 2023-02-24 ENCOUNTER — Encounter: Admit: 2023-02-24 | Discharge: 2023-02-24 | Payer: MEDICARE

## 2023-02-26 ENCOUNTER — Encounter: Admit: 2023-02-26 | Discharge: 2023-02-26 | Payer: MEDICARE

## 2023-04-24 ENCOUNTER — Encounter: Admit: 2023-04-24 | Discharge: 2023-04-24 | Payer: MEDICARE

## 2023-04-24 ENCOUNTER — Ambulatory Visit: Admit: 2023-04-24 | Discharge: 2023-04-25 | Payer: MEDICARE

## 2023-04-24 DIAGNOSIS — C220 Liver cell carcinoma: Secondary | ICD-10-CM

## 2023-04-25 ENCOUNTER — Encounter: Admit: 2023-04-25 | Discharge: 2023-04-25 | Payer: MEDICARE

## 2023-04-25 NOTE — Progress Notes
Telehealth Visit Note    Date of Service: 04/24/2023   Cancer Staging   HCC (hepatocellular carcinoma) (HCC)  Staging form: Liver, AJCC 8th Edition  - Clinical: Stage IB (cT1b, cN0, cM0) - Signed by Ortencia Kick, MD on 10/09/2022    Prostate cancer [facility])  Staging form: Prostate, AJCC 8th Edition  - Pathologic stage from 07/31/2016: Stage IIIB (pT3b, pN0, cM0, PSA: 4.5, Grade Group: 3) - Signed by Maryagnes Amos, PA-C on 02/19/2017      Subjective:      Obtained patient's verbal consent to treat them and their agreement to [place] financial policy and NPP via this telehealth visit during the [facility] Emergency    Kenneth Macdonald is a 77 y.o. male.    History of Present Illness:  Mr. Nyarko, a patient with a history of liver cancer, presents for a follow-up consultation post-surgery. They report no significant issues with their health. They deny any abdominal swelling or bleeding. Bowel movements are regular and they have not experienced any weight gain. However, they have been actively trying to lose weight and have noticed some success.    The patient expresses satisfaction with their current health status and agrees to a follow-up in three months..    Review of Systems   Constitutional:  Negative for fatigue, fever and unexpected weight change.   HENT:  Negative for mouth sores, sore throat, tinnitus, trouble swallowing and voice change.    Respiratory:  Negative for cough, choking, chest tightness, shortness of breath and wheezing.    Cardiovascular:  Negative for chest pain, palpitations and leg swelling.   Gastrointestinal:  Negative for abdominal distention, abdominal pain, anal bleeding, blood in stool, constipation, diarrhea, nausea, rectal pain and vomiting.   Genitourinary:  Negative for difficulty urinating, dysuria, frequency and hematuria.   Musculoskeletal:  Negative for arthralgias, back pain, gait problem, joint swelling, myalgias and neck pain.   Skin:  Negative for pallor, rash and wound.   Neurological:  Negative for dizziness, seizures, syncope, speech difficulty, weakness, light-headedness, numbness and headaches.   Hematological:  Negative for adenopathy. Does not bruise/bleed easily.   Psychiatric/Behavioral:  Negative for dysphoric mood and sleep disturbance. The patient is not nervous/anxious.        Objective:   acetaminophen (TYLENOL EXTRA STRENGTH) 500 mg tablet Take one tablet by mouth every 6 hours as needed for Pain. Max of 4,000 mg of acetaminophen in 24 hours.    ascorbic acid (vitamin C) 500 mg tablet Take one tablet by mouth daily.    calcium citrate (CALCITRATE) 950 mg tablet Take one tablet by mouth daily.    cholecalciferol (VITAMIN D-3) 1,000 units tablet Take one tablet by mouth daily.    fluticasone propionate (FLONASE) 50 mcg/actuation nasal spray, suspension Apply two sprays to each nostril as directed daily as needed.    lactobacillus rhamnosus GG (CULTURELLE) 10 billion cell capsule Take one capsule by mouth daily.    lisinopril-hydrochlorothiazide (PRINZIDE, ZESTORETIC) 20-12.5 mg tablet Take one tablet by mouth every morning.    loratadine (CLARITIN) 10 mg tablet Take one tablet by mouth every morning.    magnesium oxide (MAGOX) 400 mg (241.3 mg magnesium) tablet Take one tablet by mouth daily.    methocarbamoL (ROBAXIN) 750 mg tablet Take one tablet by mouth twice daily. Indications: muscle spasm    multivitamin (ONE-A-DAY) tablet Take one tablet by mouth daily.    omeprazole DR(+) (PRILOSEC) 20 mg capsule Take one capsule by mouth twice daily.  oxyCODONE (ROXICODONE) 5 mg tablet Take one tablet to two tablets by mouth every 4 hours as needed. Indications: pain    polyethylene glycol 3350 (MIRALAX) 17 gram/dose powder Take seventeen grams by mouth daily. Indications: constipation    pravastatin (PRAVACHOL) 20 mg tablet Take one tablet by mouth at bedtime daily.    sennosides-docusate sodium (SENOKOT-S) 8.6/50 mg tablet Take two tablets by mouth twice daily. Indications: constipation    vitamin E 400 unit capsule Take one capsule by mouth daily.     Vitals:    04/24/23 1430   PainSc: Zero       There is no height or weight on file to calculate BMI.      Physical Exam  Constitutional:       General: He is not in acute distress.     Appearance: Normal appearance. He is normal weight.   HENT:      Head: Normocephalic and atraumatic.   Eyes:      Conjunctiva/sclera: Conjunctivae normal.   Pulmonary:      Effort: Pulmonary effort is normal.   Skin:     Findings: No rash.   Neurological:      Mental Status: He is alert and oriented to person, place, and time.      Cranial Nerves: No cranial nerve deficit.   Psychiatric:         Mood and Affect: Mood normal.         Behavior: Behavior normal.         Thought Content: Thought content normal.         Judgment: Judgment normal.            Assessment and Plan:  Problem List          Oncology    [facility] (hepatocellular carcinoma) [facility])    Overview     history of prostate cancer s/p prostatectomy in 2018 and salvage radiation in 2019       PSA pet 01/24  IMPRESSION   Development of 5 cm tracer avid caudal right hepatic mass on a background   of diffuse hepatic steatosis (without morphologic features of cirrhosis)   which may reflect primary hepatic neoplasm rather than solitary atypical   prostate cancer metastasis.     Hepatocellular carcinoma s/p resection on 09/04/22   Final Diagnosis:     A. Gallbladder, gallbladder, cholecystectomy:  Chronic cholecystitis.   B. Liver, segment 5-6 of liver, excision:  Well-differentiated hepatocellular carcinoma. Background liver demonstrates steatohepatitis with steatosis (40%) and ballooned hepatocytes.                        hepatocellular carcinoma, stage IB due to the size of his tumor, no high-risk features, was completely excised. recurrence risk of hepatocellular carcinoma is 60 %after resection.  talked about adjuvant therapy with atezolizumab and bevacizumab. he was comfortable just going on surveillance.     Jazari is doing really well no clinical evidence of recurrence.  We discussed the results of his scans today showing no evidence of recurrent disease alpha-fetoprotein is normal.  I recommended continued surveillance we will see him back in 3 months  Plan  Follow-up in 3 months  CT scan of the chest with contrast CBC CMP alpha-fetoprotein CT of the abdomen and pelvis with and without contrast    Anemia  Improving.  -Continue monitoring with regular blood work.    General Health Maintenance  -No new complaints or symptoms. Continue current  management plan.

## 2023-04-29 ENCOUNTER — Encounter: Admit: 2023-04-29 | Discharge: 2023-04-29 | Payer: MEDICARE

## 2023-05-19 IMAGING — US US aorta
1 series · 14 of 16 positions shown · non-contrast
Comparison: No prior images are available to us.

Procedure(s): US aorta
PROCEDURE:
Screening aortic ultrasound
HISTORY: History of abdominal aortic aneurysm
TECHNIQUE: Static images from an abdominal aorta ultrasound are submitted. Gray scale, pulsed doppler, and
color doppler images were obtained.

[Series 1: us aorta · 14 of 22 slices shown]
[im 1/22]
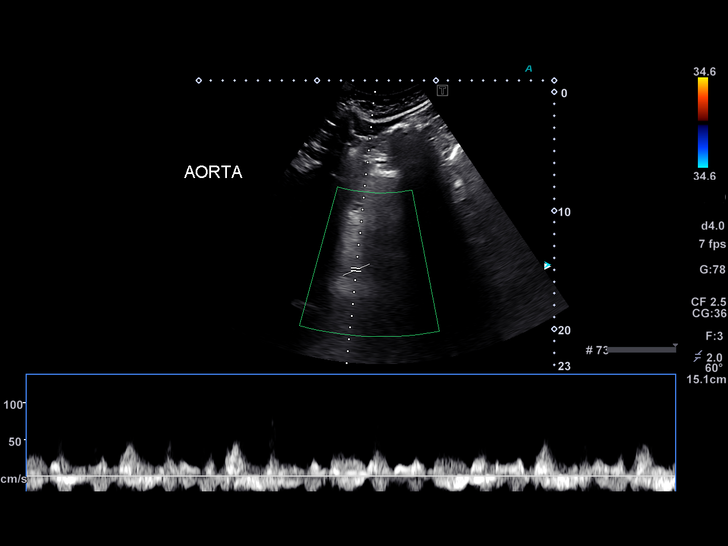
[im 2/22]
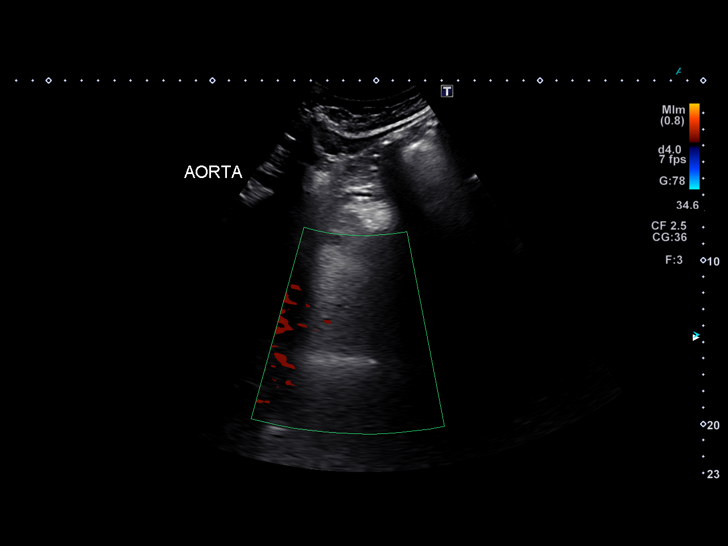
[im 3/22]
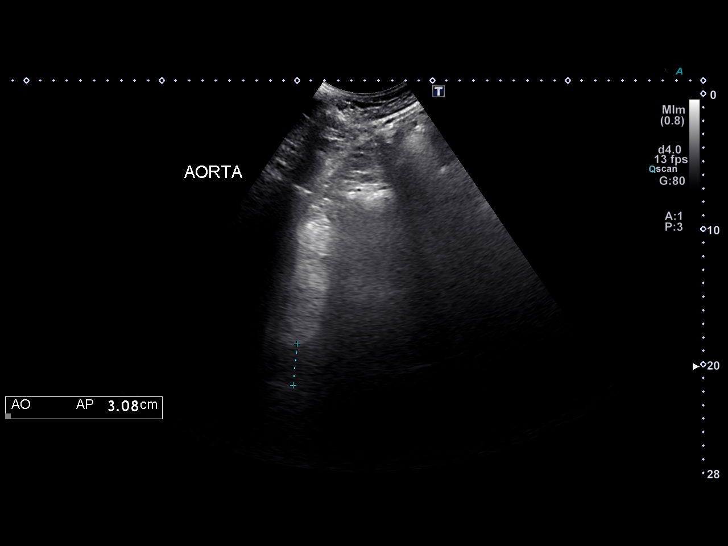
[im 6/22]
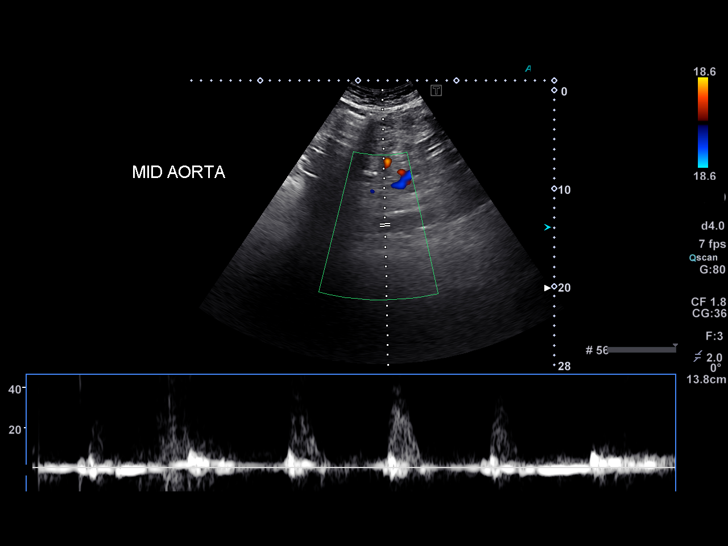
[im 8/22]
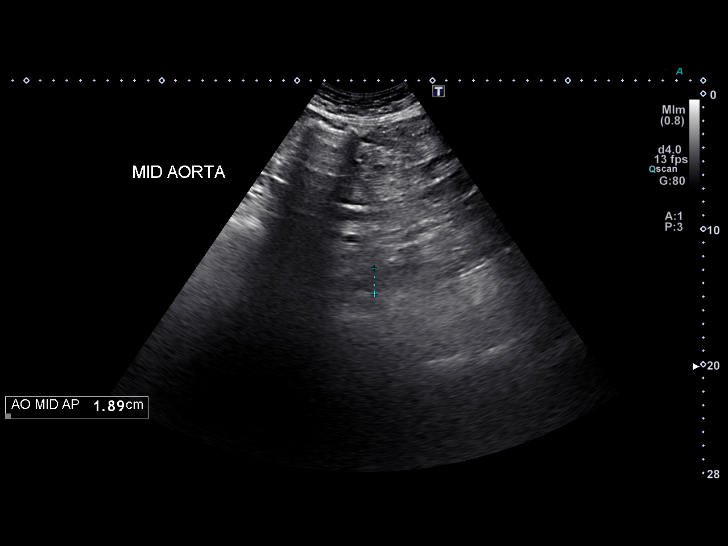
[im 9/22]
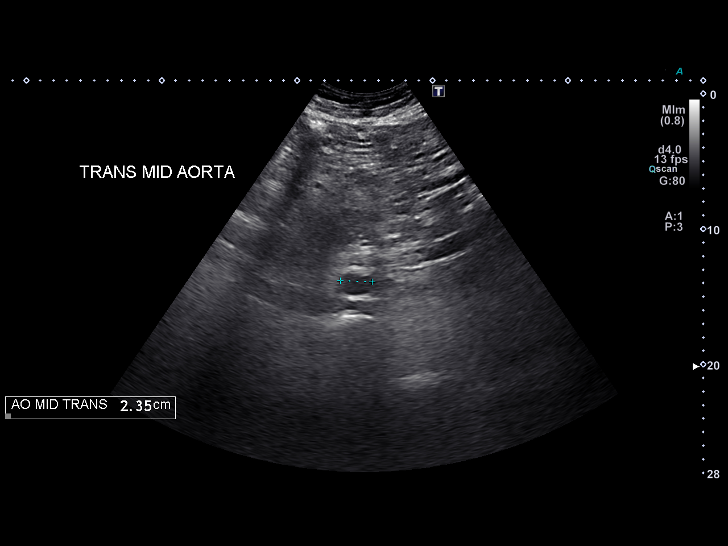
[im 10/22]
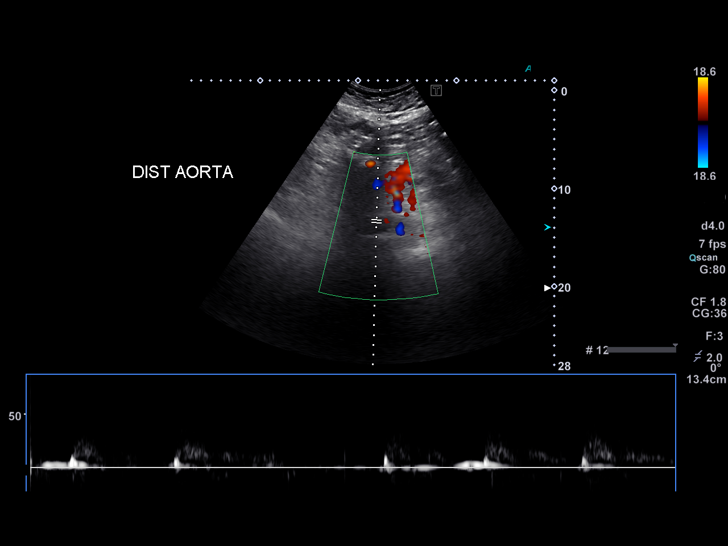
[im 12/22]
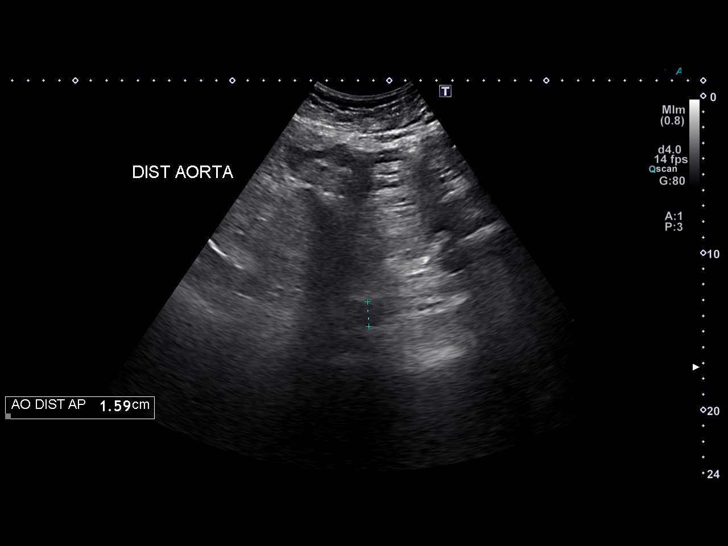
[im 13/22]
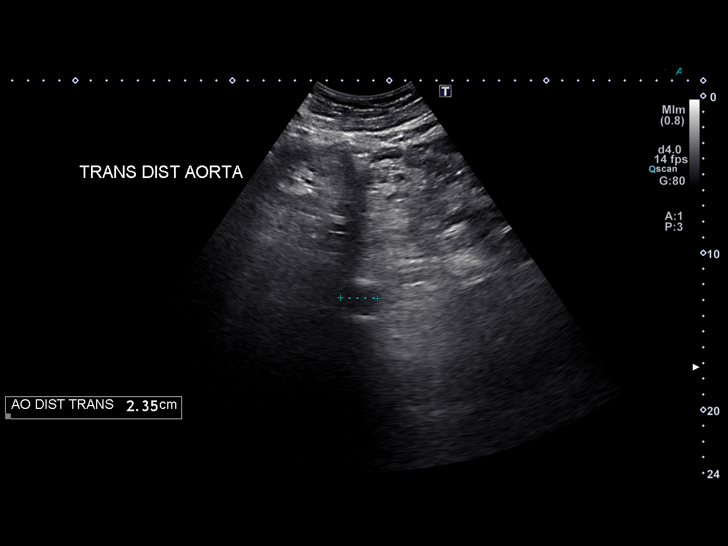
[im 15/22]
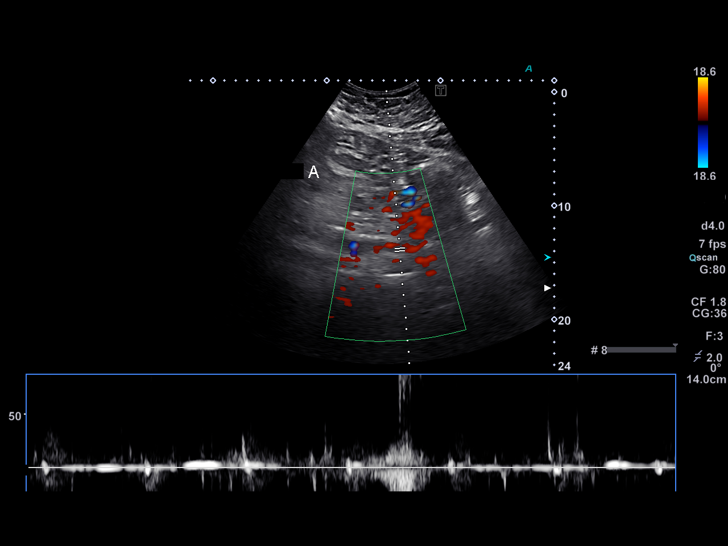
[im 17/22]
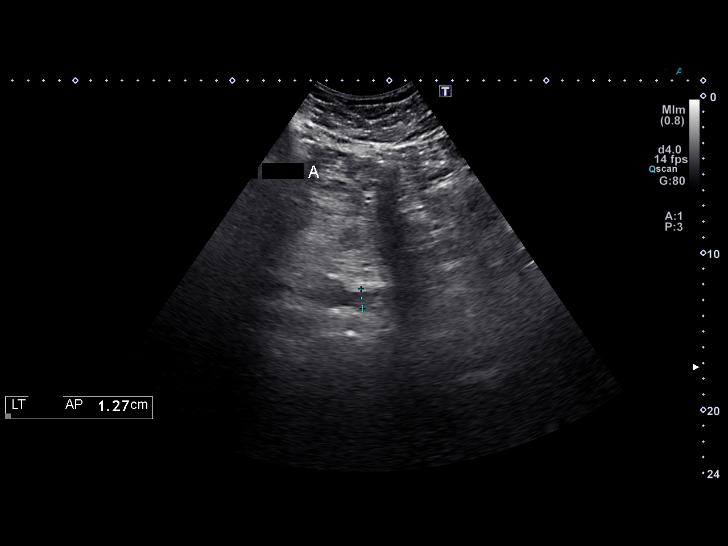
[im 19/22]
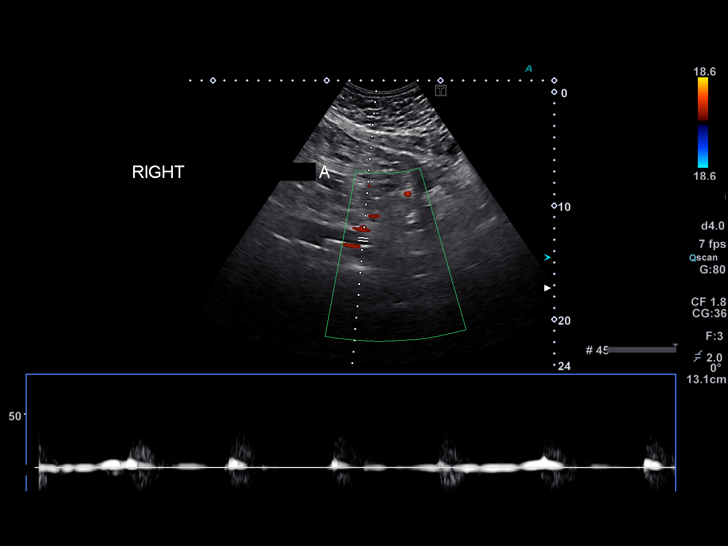
[im 20/22]
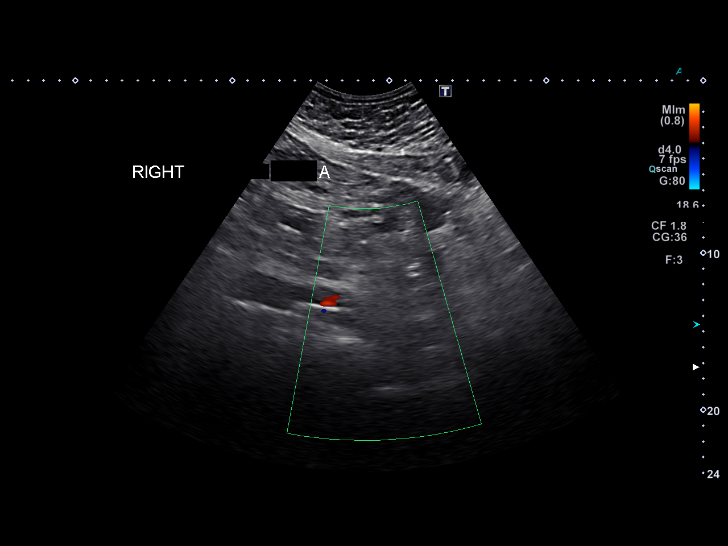
[im 22/22]
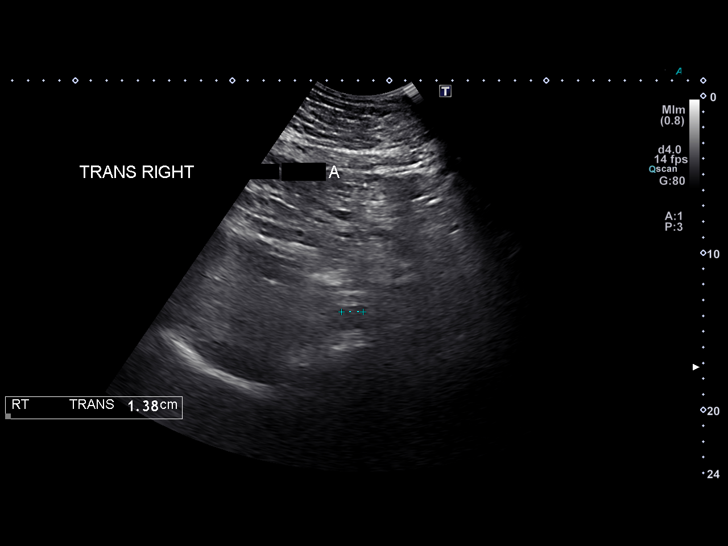

[14 of 16 positions shown; findings below may reference images not displayed]

FINDINGS: Abdominal aorta: The grayscale images are limited by lack of an optimal sonic window for
portions of the examination.  I do not see a significant degree of atherosclerotic plaque.
Proximal: 3.1 x
Mid: 1.9 x 2.4 cm
Distal: 1.6 x 2.4 cm
Plaque: Minimal if any.
Common iliac arteries:
Right common iliac artery: 1.3 cm
Left common iliac artery: 1.3 cm
Free fluid:
IMPRESSION: Minimal atherosclerotic disease.  No significant aneurysmal dilatation.

## 2023-07-31 ENCOUNTER — Ambulatory Visit: Admit: 2023-07-31 | Discharge: 2023-08-01 | Payer: MEDICARE

## 2023-07-31 ENCOUNTER — Encounter: Admit: 2023-07-31 | Discharge: 2023-07-31 | Payer: MEDICARE

## 2023-07-31 DIAGNOSIS — C220 Liver cell carcinoma: Secondary | ICD-10-CM

## 2023-07-31 LAB — CBC AND DIFF
~~LOC~~ BKR ABSOLUTE BASO COUNT: 0 10*3/uL (ref 0.00–0.20)
~~LOC~~ BKR ABSOLUTE EOS COUNT: 0.2 10*3/uL (ref 0.00–0.45)
~~LOC~~ BKR ABSOLUTE LYMPH COUNT: 1.4 10*3/uL (ref 1.00–4.80)
~~LOC~~ BKR ABSOLUTE MONO COUNT: 0.5 10*3/uL (ref 0.00–0.80)
~~LOC~~ BKR ABSOLUTE NEUTROPHIL: 2.9 10*3/uL (ref 1.80–7.00)
~~LOC~~ BKR BASOPHILS %: 1 % (ref 0–2)
~~LOC~~ BKR EOSINOPHILS %: 3 % (ref 0–5)
~~LOC~~ BKR HEMATOCRIT: 37 % — ABNORMAL LOW (ref 40.0–50.0)
~~LOC~~ BKR HEMOGLOBIN: 12 g/dL — ABNORMAL LOW (ref 13.5–16.5)
~~LOC~~ BKR LYMPHOCYTES %: 29 % (ref 24–44)
~~LOC~~ BKR MCH: 30 pg (ref 26.0–34.0)
~~LOC~~ BKR MCHC: 33 g/dL (ref 32.0–36.0)
~~LOC~~ BKR MCV: 92 fL (ref 80.0–100.0)
~~LOC~~ BKR MONOCYTES %: 9 % (ref 4–12)
~~LOC~~ BKR MPV: 7.1 fL (ref 7.0–11.0)
~~LOC~~ BKR PLATELET COUNT: 199 10*3/uL (ref 150–400)
~~LOC~~ BKR RBC COUNT: 4 10*6/uL — ABNORMAL LOW (ref 4.40–5.50)
~~LOC~~ BKR RDW: 14 % (ref 11.0–15.0)
~~LOC~~ BKR WBC COUNT: 5 10*3/uL (ref 4.50–11.00)

## 2023-07-31 LAB — COMPREHENSIVE METABOLIC PANEL
~~LOC~~ BKR AST: 19 U/L (ref 7–40)
~~LOC~~ BKR SODIUM, SERUM: 135 mmol/L — ABNORMAL LOW (ref 137–147)

## 2023-08-01 ENCOUNTER — Encounter: Admit: 2023-08-01 | Discharge: 2023-08-01 | Payer: MEDICARE

## 2023-08-01 DIAGNOSIS — C61 Malignant neoplasm of prostate: Secondary | ICD-10-CM

## 2023-08-02 ENCOUNTER — Ambulatory Visit: Admit: 2023-08-02 | Discharge: 2023-08-03 | Payer: MEDICARE

## 2023-08-02 ENCOUNTER — Encounter: Admit: 2023-08-02 | Discharge: 2023-08-02 | Payer: MEDICARE

## 2023-08-03 NOTE — Progress Notes
 Telehealth Visit Note    Date of Service: 08/09/2023    Subjective:           Acey Woodfield is a 78 y.o. male.    Site Treatment Dates Technique Energy Dose/ Fraction (cGy) Total Dose (cGy) Fractions Missed Treatment Days   Prostate bed 10/16/17 - 12/05/17 IMRT 6X [id number] 2   Concurrent systemic therapy:  None    History of Present Illness    Mr. 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 PSA subsequently increased to 0.11.   He was treated with a course of salvage radiation therapy to the prostate bed completed 12/05/2017.       His PSA since completion of salvage radiation has been increasing.  Due to rising PSA, a PSMA PET was obtained on 07/16/22.  This identified no clear location of the prostate cancer recurrence.  However, there was noted a 5 cm PSMA avid liver.  Biopsy was positive for hepatocellular cancer.  He had partial hepatectomy 09/04/22.  He now is without evidence of recurrence of the liver cancer.  Plan for surveillance.     We have been monitoring his rising PSA.    On occasion he has some blood in the urine x 1 week.  The last time he had some blood was about 2 months ago.  This is quite intermittent.    Mr. Furuya returns following PSMA PET scan.                 Objective:          acetaminophen (TYLENOL EXTRA STRENGTH) 500 mg tablet Take one tablet by mouth every 6 hours as needed for Pain. Max of 4,000 mg of acetaminophen in 24 hours.    ascorbic acid (vitamin C) 500 mg tablet Take one tablet by mouth daily.    calcium citrate (CALCITRATE) 950 mg tablet Take one tablet by mouth daily.    cholecalciferol (VITAMIN D-3) 1,000 units tablet Take one tablet by mouth daily.    fluticasone propionate (FLONASE) 50 mcg/actuation nasal spray, suspension Apply two sprays to each nostril as directed daily as needed.    lactobacillus rhamnosus GG (CULTURELLE) 10 billion cell capsule Take one capsule by mouth daily.    lisinopril-hydrochlorothiazide (PRINZIDE, ZESTORETIC) 20-12.5 mg tablet Take one tablet by mouth every morning.    loratadine (CLARITIN) 10 mg tablet Take one tablet by mouth every morning.    magnesium oxide (MAGOX) 400 mg (241.3 mg magnesium) tablet Take one tablet by mouth daily.    methocarbamoL (ROBAXIN) 750 mg tablet Take one tablet by mouth twice daily. Indications: muscle spasm    multivitamin (ONE-A-DAY) tablet Take one tablet by mouth daily.    omeprazole DR(+) (PRILOSEC) 20 mg capsule Take one capsule by mouth twice daily.    oxyCODONE (ROXICODONE) 5 mg tablet Take one tablet to two tablets by mouth every 4 hours as needed. Indications: pain    polyethylene glycol 3350 (MIRALAX) 17 gram/dose powder Take seventeen grams by mouth daily. Indications: constipation    pravastatin (PRAVACHOL) 20 mg tablet Take one tablet by mouth at bedtime daily.    sennosides-docusate sodium (SENOKOT-S) 8.6/50 mg tablet Take two tablets by mouth twice daily. Indications: constipation    vitamin E 400 unit capsule Take one capsule by mouth daily.           Computed Telehealth Body Mass  Index unavailable. One or more values for this score either were not found within the given timeframe or did not fit some other criterion.    Physical Exam      Lab Results   Component Value Date    PSA 0.50 08/02/2023    PSA 0.43 02/13/2023    PSA 0.42 01/11/2023    PSA 0.40 11/06/2022    PSA 0.36 09/03/2022    PSA 0.39 06/19/2022    PSA 0.35 05/21/2022    PSA 0.21 11/08/2021    PSA 0.21 07/06/2021    PSA 0.21 04/19/2021     NM PET PROSTATE CA  Result Date: 08/05/2023  NM PET PROSTATE CA Radiopharmaceutical: 8.7 mCi F-18 Piflufolastat IV Clinical Indication: Prostate cancer Technique: PET imaging was performed from the thighs to skull 105 minutes after tracer administration. Low dose non-contrast CT imaging was performed for attenuation correction and localization purposes. Comparison: PET/CT 07/16/2022, CT cap 07/31/2023 FINDINGS: Head/Neck: No tracer avid lesion(s). Chest: No tracer avid lesion(s). Abdomen/Pelvis: No tracer avid lesion(s). There has been resection of previously noted tracer avid hepatic lesion in segment 4 of the liver noted to be well differentiated hepatocellular carcinoma on pathology. Osseous Structures: No tracer avid lesion(s). Additional CT Findings: See recent CT chest, abdomen, and pelvis.      Prior prostatectomy without  tracer avid recurrent lesion or metastasis.  Approved by Myrtie Soman, MD on 08/05/2023 2:01 PM By my electronic signature, I attest that I have personally reviewed the images for this examination and formulated the interpretations and opinions expressed in this report  Finalized by Particia Jasper, M.D. on 08/05/2023 2:02 PM. Dictated by Myrtie Soman, MD on 08/05/2023 1:37 PM.         Assessment and Plan:    Mr. 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  He has had a slowly rising PSA up to 0.50 on 08/02/2023.  He has a PSA doubling time of about 18 months.     He has had interval diagnosis of hepatocellular cancer and resection.  He is NED with regards to the [facility].     In the meantime, his PSA is increasing very slowly.  PSMA PET did not detect any focus of recurrence.    I recommend to continue to monitor the PSA.     Plan:  Continue to monitor PSA  Continue to monitor the occasional hematuria.  Would recommend reevaluation if this becomes more frequent.  Repeat PSMA PET in about a year or when PSA reaches about 1.0  Follow-up in 6 months with NP provider    Harriette Bouillon, MD  Attending Physician  Department of Radiation Oncology, [facility]  ATTESTATION    I personally performed the E/M including history, physical exam, and MDM.      Staff name:  Harriette Bouillon, MD Date:  08/09/2023

## 2023-08-05 ENCOUNTER — Ambulatory Visit: Admit: 2023-08-05 | Discharge: 2023-08-06 | Payer: MEDICARE

## 2023-08-05 ENCOUNTER — Encounter: Admit: 2023-08-05 | Discharge: 2023-08-05 | Payer: MEDICARE

## 2023-08-09 ENCOUNTER — Ambulatory Visit: Admit: 2023-08-09 | Discharge: 2023-08-09 | Payer: MEDICARE

## 2023-08-09 DIAGNOSIS — C61 Malignant neoplasm of prostate: Secondary | ICD-10-CM

## 2023-08-09 DIAGNOSIS — R310 Gross hematuria: Secondary | ICD-10-CM

## 2023-08-12 NOTE — Progress Notes
 Name: Kenneth Macdonald       MRN: [id number] DOB: 01-08-1946      AGE: 78 y.o.    Prostate Cancer Follow-up Visit  Visit Type: Telehealth    Date of Service: 08/13/2023      Cancer Staging   HCC (hepatocellular carcinoma) (HCC)  Staging form: Liver, AJCC 8th Edition  - Clinical: Stage IB (cT1b, cN0, cM0) - Signed by Ortencia Kick, MD on 10/09/2022    Prostate cancer [facility])  Staging form: Prostate, AJCC 8th Edition  - Pathologic stage from 07/31/2016: Stage IIIB (pT3b, pN0, cM0, PSA: 4.5, Grade Group: 3) - Signed by Maryagnes Amos, PA-C on 02/19/2017      Subjective:               Kenneth Macdonald is a very pleasant 78 y.o. man who underwent an anterior prostatectomy with bilateral wide dissection and bilateral pelvic node dissection on 07/31/2016.  His pathology was notable for grade group 3, pT3bN0MxR1 disease.  He then completed salvage radiation to the prostate fossa in June 2019 for rising PSA. He did have initial PSA response however his PSA is subsequently started to rise. In November 2022 his PSA had risen to 0.21 making the definition of nonmetastatic biochemical recurrence. In January of 2024 he underwent a PSMA PET/CT for a PSA of 0.39 which revealed a concerning liver lesion.  On 09/04/2022 he underwent a liver resection for hepatocellular carcinoma which was complicated by a bile leak that was managed endoscopically.  He has been on observation for his hepatocellular carcinoma without recurrence documented.  He more recently completed a repeat PSMA PET/CT on 08/05/2023 for a PSA of 0.50 without evidence of recurrence of his prostate cancer or hepatocellular carcinoma.      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 He has had had some intermittent recurrence of hematuria.     He continues to go through multiple pads daily and nocturia that is stable.  He is not bothered by his incontinence.                 Objective: acetaminophen (TYLENOL EXTRA STRENGTH) 500 mg tablet Take one tablet by mouth every 6 hours as needed for Pain. Max of 4,000 mg of acetaminophen in 24 hours.    ascorbic acid (vitamin C) 500 mg tablet Take one tablet by mouth daily.    calcium citrate (CALCITRATE) 950 mg tablet Take one tablet by mouth daily.    cholecalciferol (VITAMIN D-3) 1,000 units tablet Take one tablet by mouth daily.    fluticasone propionate (FLONASE) 50 mcg/actuation nasal spray, suspension Apply two sprays to each nostril as directed daily as needed.    lactobacillus rhamnosus GG (CULTURELLE) 10 billion cell capsule Take one capsule by mouth daily.    lisinopril-hydrochlorothiazide (PRINZIDE, ZESTORETIC) 20-12.5 mg tablet Take one tablet by mouth every morning.    loratadine (CLARITIN) 10 mg tablet Take one tablet by mouth every morning.    magnesium oxide (MAGOX) 400 mg (241.3 mg magnesium) tablet Take one tablet by mouth daily.    methocarbamoL (ROBAXIN) 750 mg tablet Take one tablet by mouth twice daily. Indications: muscle spasm    multivitamin (ONE-A-DAY) tablet Take one tablet by mouth daily.    omeprazole DR(+) (PRILOSEC) 20 mg capsule Take one capsule by mouth twice daily.    oxyCODONE (ROXICODONE) 5 mg tablet Take one tablet to two tablets by mouth  every 4 hours as needed. Indications: pain    polyethylene glycol 3350 (MIRALAX) 17 gram/dose powder Take seventeen grams by mouth daily. Indications: constipation    pravastatin (PRAVACHOL) 20 mg tablet Take one tablet by mouth at bedtime daily.    sennosides-docusate sodium (SENOKOT-S) 8.6/50 mg tablet Take two tablets by mouth twice daily. Indications: constipation    vitamin E 400 unit capsule Take one capsule by mouth daily.     Vitals:    08/13/23 1223   PainSc: Three     There is no height or weight on file to calculate BMI.     Pain Score: Three    ECOG performance status is 0, Fully active, able to carry on all pre-disease performance without restriction.    Physical Examination:  Telehealth visit: patient appears stated age and in no acute distress.    Lab Results   Component Value Date    PSA 0.50 08/02/2023    PSA 0.43 02/13/2023    PSA 0.42 01/11/2023    PSA 0.40 11/06/2022    PSA 0.36 09/03/2022          Assessment and Plan:          ~~~~~~~~~~~~~  Diagnosis: Prostate cancer     Plan:  -  PSA reviewed along with PSMA PET/CT.  I recommended PSA surveillance with repeat imaging if the PSA reaches 1.0.  He is visiting with radiation oncology in 6 months; we will have him return in 1 year.  We will alternate appointments with radiation oncology.    -  He will notify us if his hematuria becomes more significant    Myna Hidalgo, MD  Urologic Oncology  Department of Urology      Some of this note may have been generated with dictation software and transcription errors can occur - this is not intentional.  If you notice an error that you would like corrected, please message my team through MyChart.    Test results will be available on MyChart for your review and you may have access to results before we have an opportunity to review the results.  We make every effort to respond to test results as efficiently as possible and we ask that you allow at least 2 business days for our team to review and respond to results prior to reaching out to request results or ask for clarification on results.        ~~~~~~~~~~~~~

## 2023-08-13 ENCOUNTER — Encounter: Admit: 2023-08-13 | Discharge: 2023-08-13 | Payer: MEDICARE

## 2023-08-13 DIAGNOSIS — C61 Malignant neoplasm of prostate: Secondary | ICD-10-CM

## 2023-11-27 ENCOUNTER — Encounter: Admit: 2023-11-27 | Discharge: 2023-11-27 | Payer: MEDICARE

## 2023-11-27 DIAGNOSIS — C220 Liver cell carcinoma: Secondary | ICD-10-CM

## 2023-11-27 LAB — CBC AND DIFF
~~LOC~~ BKR ABSOLUTE BASO COUNT: 0 10*3/uL (ref 0.00–0.20)
~~LOC~~ BKR ABSOLUTE EOS COUNT: 0.2 10*3/uL (ref 0.00–0.45)
~~LOC~~ BKR ABSOLUTE MONO COUNT: 0.6 10*3/uL (ref 0.00–0.80)
~~LOC~~ BKR HEMATOCRIT: 38 % — ABNORMAL LOW (ref 40.0–50.0)
~~LOC~~ BKR MCH: 31 pg (ref 26.0–34.0)
~~LOC~~ BKR MCHC: 33 g/dL (ref 32.0–36.0)
~~LOC~~ BKR MCV: 92 fL — ABNORMAL HIGH (ref 80.0–100.0)
~~LOC~~ BKR NEUTROPHILS %: 62 % (ref 41.0–77.0)
~~LOC~~ BKR RBC COUNT: 4.1 10*6/uL — ABNORMAL LOW (ref 4.40–5.50)
~~LOC~~ BKR RDW: 14 % (ref 11.0–15.0)
~~LOC~~ BKR WBC COUNT: 5 10*3/uL (ref 4.50–11.00)

## 2023-11-27 LAB — COMPREHENSIVE METABOLIC PANEL
~~LOC~~ BKR ALT: 21 U/L (ref 7–56)
~~LOC~~ BKR ANION GAP: 9 10*3/uL (ref 3–12)
~~LOC~~ BKR AST: 22 U/L (ref 7–40)
~~LOC~~ BKR CO2: 27 mmol/L (ref 21–30)
~~LOC~~ BKR GLOMERULAR FILTRATION RATE (GFR): >60 mL/min (ref >60–4.80)
~~LOC~~ BKR POTASSIUM: 4.2 mmol/L — ABNORMAL LOW (ref 3.5–5.1)

## 2023-11-27 LAB — PROTIME INR (PT): ~~LOC~~ BKR PROTIME: 11 s (ref 9.9–14.2)

## 2023-11-27 LAB — ALPHA FETO PROTEIN (AFP), NON-PREGNANT: ~~LOC~~ BKR AFP: 2.5 ng/mL (ref 0.0–15.0)

## 2023-11-27 MED ADMIN — IOHEXOL 350 MG IODINE/ML IV SOLN [81210]: 100 mL | INTRAVENOUS | @ 13:00:00 | Stop: 2023-11-27 | NDC 00407141491

## 2023-11-27 MED ADMIN — SODIUM CHLORIDE 0.9 % IJ SOLN [7319]: 50 mL | INTRAVENOUS | @ 13:00:00 | Stop: 2023-11-27 | NDC 00409488850

## 2023-11-28 ENCOUNTER — Encounter: Admit: 2023-11-28 | Discharge: 2023-11-28 | Payer: MEDICARE

## 2024-02-06 ENCOUNTER — Encounter: Admit: 2024-02-06 | Discharge: 2024-02-06 | Payer: MEDICARE

## 2024-02-06 DIAGNOSIS — C61 Malignant neoplasm of prostate: Principal | ICD-10-CM

## 2024-02-06 NOTE — Progress Notes
 Telehealth Visit Note    Date of Service: 02/07/2024    Subjective:           Kenneth Macdonald is a 78 y.o. male.    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 PSA subsequently increased to 0.11.   He was treated with a course of salvage radiation therapy to the prostate bed completed 12/05/2017.       His PSA since completion of salvage radiation has been increasing.  Due to rising PSA, a PSMA PET was obtained on 07/16/22.  This identified no clear location of the prostate cancer recurrence.  However, there was noted a 5 cm PSMA avid liver.  Biopsy was positive for hepatocellular cancer.  He had partial hepatectomy 09/04/22.  He now is without evidence of recurrence of the liver cancer.  Plan for surveillance.     We have been monitoring his rising PSA.    On occasion he has some blood in the urine x 1 week.  The last time he had some blood was about 2 months ago.  This is quite intermittent.    Kenneth Macdonald returns following PSMA PET scan.                 Objective:          acetaminophen  (TYLENOL  EXTRA STRENGTH) 500 mg tablet Take one tablet by mouth every 6 hours as needed for Pain. Max of 4,000 mg of acetaminophen  in 24 hours.    ascorbic acid (vitamin C) 500 mg tablet Take one tablet by mouth daily.    calcium citrate (CALCITRATE) 950 mg tablet Take one tablet by mouth daily.    cholecalciferol (VITAMIN D-3) 1,000 units tablet Take one tablet by mouth daily.    fluticasone propionate (FLONASE) 50 mcg/actuation nasal spray, suspension Apply two sprays to each nostril as directed daily as needed.    lactobacillus rhamnosus GG (CULTURELLE) 10 billion cell capsule Take one capsule by mouth daily.    lisinopril-hydrochlorothiazide (PRINZIDE, ZESTORETIC) 20-12.5 mg tablet Take one tablet by mouth every morning.    loratadine (CLARITIN) 10 mg tablet Take one tablet by mouth every morning.    magnesium  oxide (MAGOX) 400 mg (241.3 mg magnesium ) tablet Take one tablet by mouth daily.    multivitamin (ONE-A-DAY) tablet Take one tablet by mouth daily.    omeprazole DR(+) (PRILOSEC) 20 mg capsule Take one capsule by mouth twice daily.    polyethylene glycol 3350  (MIRALAX ) 17 gram/dose powder Take seventeen grams by mouth daily. Indications: constipation (Patient not taking: Reported on 02/07/2024)    pravastatin  (PRAVACHOL ) 20 mg tablet Take one tablet by mouth at bedtime daily.    sennosides-docusate sodium  (SENOKOT-S) 8.6/50 mg tablet Take two tablets by mouth twice daily. Indications: constipation (Patient not taking: Reported on 02/07/2024)    vitamin E 400 unit capsule Take one capsule by mouth daily.           Computed Telehealth Body Mass Index unavailable. One or more values for this score either were not found within the given timeframe or did not fit some other criterion.    Physical Exam  General:  A&Ox3, in no acute distress.  Head:  Normocephalic and atraumatic.  Eyes: Normal sclerae / conjunctivae.  Neck: trachea midline  Lungs: Breathing nonlabored, no wheezing  Abdomen: nondistended  Skin: no rashes or pallor  Psychiatric: Normal mood and affect        Lab Results   Component Value Date    PSA 0.68 02/07/2024    PSA 0.50 08/02/2023    PSA 0.43 02/13/2023    PSA 0.42 01/11/2023    PSA 0.40 11/06/2022    PSA 0.36 09/03/2022    PSA 0.39 06/19/2022    PSA 0.35 05/21/2022    PSA 0.21 11/08/2021    PSA 0.21 07/06/2021     No results found for: TESTOSTER    NM PET PROSTATE CA  Result Date: 08/05/2023  NM PET PROSTATE CA Radiopharmaceutical: 8.7 mCi F-18 Piflufolastat IV Clinical Indication: Prostate cancer Technique: PET imaging was performed from the thighs to skull 105 minutes after tracer administration. Low dose non-contrast CT imaging was performed for attenuation correction and localization purposes. Comparison: PET/CT 07/16/2022, CT cap 07/31/2023 FINDINGS: Head/Neck: No tracer avid lesion(s). Chest: No tracer avid lesion(s). Abdomen/Pelvis: No tracer avid lesion(s). There has been resection of previously noted tracer avid hepatic lesion in segment 4 of the liver noted to be well differentiated hepatocellular carcinoma on pathology. Osseous Structures: No tracer avid lesion(s). Additional CT Findings: See recent CT chest, abdomen, and pelvis.      Prior prostatectomy without  tracer avid recurrent lesion or metastasis.  Approved by Calla Blackwater, MD on 08/05/2023 2:01 PM By my electronic signature, I attest that I have personally reviewed the images for this examination and formulated the interpretations and opinions expressed in this report  Finalized by Wendell Yap, M.D. on 08/05/2023 2:02 PM. Dictated by Calla Blackwater, MD on 08/05/2023 1:37 PM.         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  He has had a slowly rising PSA up to 0.68 on 0.68  He has a PSA doubling time of about 18 months.     He has had interval diagnosis of hepatocellular cancer and resection.  He is NED with regards to the [facility].     His PSA has been increasing slowly. He had a PSMA Pet that did not identify areas of recurrence. Will plan to get another PSMA in 1 year or if PSA reaches 1.0. (Last PSMA 08/05/23)      Plan:  - Follow up in 6 months  - Continue to monitor PSA        Izetta Baar APRN, FNP-C  Nurse Practitioner  Department of Radiation Oncology [place]      Staff name:  Comer Baar, APRN-NP Date:  02/06/2024    Total time including obtaining and review of records, patient face to face time, order entry, placing referrals, and documentation: 30 minutes

## 2024-02-07 ENCOUNTER — Ambulatory Visit: Admit: 2024-02-07 | Discharge: 2024-02-07 | Payer: MEDICARE

## 2024-02-07 ENCOUNTER — Encounter: Admit: 2024-02-07 | Discharge: 2024-02-07 | Payer: MEDICARE

## 2024-02-07 DIAGNOSIS — C61 Malignant neoplasm of prostate: Principal | ICD-10-CM

## 2024-02-07 LAB — PROSTATE SPECIFIC ANTIGEN (MONITORING): ~~LOC~~ BKR PROSTATE-SPECIFIC ANTIGEN: 0.6 ng/mL

## 2024-04-03 ENCOUNTER — Encounter: Admit: 2024-04-03 | Discharge: 2024-04-03 | Payer: MEDICARE

## 2024-04-03 ENCOUNTER — Ambulatory Visit: Admit: 2024-04-03 | Discharge: 2024-04-03 | Payer: MEDICARE

## 2024-04-03 VITALS — BP 142/69 | HR 81 | Temp 97.50000°F | Resp 17 | Wt 299.0 lb

## 2024-04-03 DIAGNOSIS — C220 Liver cell carcinoma: Principal | ICD-10-CM

## 2024-04-03 NOTE — Progress Notes
 Telehealth Visit Note    Date of Service: 04/03/2024   Cancer Staging   HCC (hepatocellular carcinoma) (CMS-HCC)  Staging form: Liver, AJCC 8th Edition  - Clinical: Stage IB (cT1b, cN0, cM0) - Signed by Kerrie Kelton HERO, MD on 10/09/2022    Prostate cancer (CMS-HCC)  Staging form: Prostate, AJCC 8th Edition  - Pathologic stage from 07/31/2016: Stage IIIB (pT3b, pN0, cM0, PSA: 4.5, Grade Group: 3) - Signed by Kriste Nest, PA-C on 02/19/2017      Subjective:          Kenneth Macdonald is a 78 y.o. male.    History of Present Illness:    Kenneth Macdonald is a 78 year old male with liver cancer who presents for routine follow-up.    He is here for a routine follow-up after previous treatment for liver cancer. No abdominal pain, nausea, vomiting, or changes in bowel movements. No swelling. He reports bleeding in stools. No recent hospitalizations or emergency room visits.    His previous liver surgery was in March 2024, and he has been following up regularly since then. He continues to see Dr. Kennyth for ongoing care. His current surveillance schedule involves follow-ups every four months.    He mentions that his wife is experiencing significant neck pain and has undergone multiple neck surgeries. She is scheduled for a procedure to address nerve pain.    Review of Systems   Constitutional:  Negative for fatigue, fever and unexpected weight change.   HENT:  Negative for mouth sores, sore throat, tinnitus, trouble swallowing and voice change.    Respiratory:  Negative for cough, choking, chest tightness, shortness of breath and wheezing.    Cardiovascular:  Negative for chest pain, palpitations and leg swelling.   Gastrointestinal:  Negative for abdominal distention, abdominal pain, anal bleeding, blood in stool, constipation, diarrhea, nausea, rectal pain and vomiting.   Genitourinary:  Negative for difficulty urinating, dysuria, frequency and hematuria.   Musculoskeletal:  Negative for arthralgias, back pain, gait problem, joint swelling, myalgias and neck pain.   Skin:  Negative for pallor, rash and wound.   Neurological:  Negative for dizziness, seizures, syncope, speech difficulty, weakness, light-headedness, numbness and headaches.   Hematological:  Negative for adenopathy. Does not bruise/bleed easily.   Psychiatric/Behavioral:  Negative for dysphoric mood and sleep disturbance. The patient is not nervous/anxious.        Objective:   acetaminophen  (TYLENOL  EXTRA STRENGTH) 500 mg tablet Take one tablet by mouth every 6 hours as needed for Pain. Max of 4,000 mg of acetaminophen  in 24 hours.    ascorbic acid (vitamin C) 500 mg tablet Take one tablet by mouth daily.    calcium citrate (CALCITRATE) 950 mg tablet Take one tablet by mouth daily.    cholecalciferol (VITAMIN D-3) 1,000 units tablet Take one tablet by mouth daily.    fluticasone propionate (FLONASE) 50 mcg/actuation nasal spray, suspension Apply two sprays to each nostril as directed daily as needed.    lactobacillus rhamnosus GG (CULTURELLE) 10 billion cell capsule Take one capsule by mouth daily.    lisinopril-hydrochlorothiazide (PRINZIDE, ZESTORETIC) 20-12.5 mg tablet Take one tablet by mouth every morning.    loratadine (CLARITIN) 10 mg tablet Take one tablet by mouth every morning.    magnesium  oxide (MAGOX) 400 mg (241.3 mg magnesium ) tablet Take one tablet by mouth daily.    multivitamin (ONE-A-DAY) tablet Take one tablet by mouth daily.    omeprazole DR(+) (PRILOSEC) 20 mg capsule Take one  capsule by mouth twice daily.    polyethylene glycol 3350  (MIRALAX ) 17 gram/dose powder Take seventeen grams by mouth daily. Indications: constipation    pravastatin  (PRAVACHOL ) 20 mg tablet Take one tablet by mouth at bedtime daily.    sennosides-docusate sodium  (SENOKOT-S) 8.6/50 mg tablet Take two tablets by mouth twice daily. Indications: constipation    vitamin E 400 unit capsule Take one capsule by mouth daily.     Vitals:    04/03/24 1233   BP: (!) 142/69   BP Source: Arm, Left Upper   Pulse: 81   Temp: 36.4 ?C (97.5 ?F)   Resp: 17   SpO2: 95%   TempSrc: Temporal   PainSc: Zero   Weight: 135.6 kg (299 lb)       Body mass index is 40.55 kg/m?SABRA      Physical Exam  Constitutional:       General: He is not in acute distress.     Appearance: Normal appearance. He is normal weight.   HENT:      Head: Normocephalic and atraumatic.   Eyes:      Conjunctiva/sclera: Conjunctivae normal.   Pulmonary:      Effort: Pulmonary effort is normal.   Skin:     Findings: No rash.   Neurological:      Mental Status: He is alert and oriented to person, place, and time.      Cranial Nerves: No cranial nerve deficit.   Psychiatric:         Mood and Affect: Mood normal.         Behavior: Behavior normal.         Thought Content: Thought content normal.         Judgment: Judgment normal.            Assessment and Plan:  Problem List          Oncology    [facility] (hepatocellular carcinoma) (CMS-HCC)    Overview   history of prostate cancer s/p prostatectomy in 2018 and salvage radiation in 2019       PSA pet 01/24  IMPRESSION   Development of 5 cm tracer avid caudal right hepatic mass on a background   of diffuse hepatic steatosis (without morphologic features of cirrhosis)   which may reflect primary hepatic neoplasm rather than solitary atypical   prostate cancer metastasis.     Hepatocellular carcinoma s/p resection on 09/04/22   Final Diagnosis:     A. Gallbladder, gallbladder, cholecystectomy:  Chronic cholecystitis.   B. Liver, segment 5-6 of liver, excision:  Well-differentiated hepatocellular carcinoma. Background liver demonstrates steatohepatitis with steatosis (40%) and ballooned hepatocytes.                        hepatocellular carcinoma, stage IB due to the size of his tumor, no high-risk features, was completely excised. recurrence risk of hepatocellular carcinoma is 60 %after resection.  talked about adjuvant therapy with atezolizumab and bevacizumab. he was comfortable just going on surveillance.     Shine doing really well no clinical evidence of recurrence.  We discussed the results of his scans today showing no evidence of recurrent disease at the surgical site no suspicious sites in the liver would recommend continued surveillance we will see him back in 4 months.    Plan  Follow-up in 4 months  CT scan of the chest with contrast CBC CMP alpha-fetoprotein CT of the abdomen and pelvis with and without contrast  Fatty liver  Fatty liver with associated low blood counts. Liver function is well-managed. No evidence of cancer recurrence or metastasis in the liver. Discussed potential benefit of Ozempic to decrease liver inflammation. No concerns with initiating Ozempic.  - Cleared to start Ozempic for fatty liver from the oncology standpoint.    Arthritis  Arthritis causing pain and limited mobility, impacting daily activities.    Anemia  Improving.  -Continue monitoring with regular blood work.    General Health Maintenance  -No new complaints or symptoms. Continue current management plan.
